# Patient Record
Sex: Female | Born: 1995 | Race: White | Hispanic: No | Marital: Married | Smoking: Never smoker
Health system: Southern US, Community
[De-identification: ages and names within clinical notes are randomized; demographics above are authoritative.]

## PROBLEM LIST (undated history)

## (undated) DIAGNOSIS — R945 Abnormal results of liver function studies: Secondary | ICD-10-CM

## (undated) DIAGNOSIS — R519 Headache, unspecified: Secondary | ICD-10-CM

## (undated) DIAGNOSIS — Z8489 Family history of other specified conditions: Secondary | ICD-10-CM

## (undated) DIAGNOSIS — R51 Headache: Secondary | ICD-10-CM

## (undated) DIAGNOSIS — K2970 Gastritis, unspecified, without bleeding: Secondary | ICD-10-CM

## (undated) DIAGNOSIS — R7989 Other specified abnormal findings of blood chemistry: Secondary | ICD-10-CM

## (undated) HISTORY — DX: Gastritis, unspecified, without bleeding: K29.70

---
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
## 2014-05-08 ENCOUNTER — Emergency Department: Payer: Self-pay | Admitting: Emergency Medicine
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
## 2016-12-17 NOTE — L&D Delivery Note (Signed)
Operative Delivery Note At  a  Viable  female was delivered via .VAVD   Presentation: vertex; Position: Right,, Occiput,, Anterior; Station: +5.  Verbal consent: obtained from patient.  Risks and benefits discussed in detail.  Risks include, but are not limited to the risks of anesthesia, bleeding, infection, damage to maternal tissues, fetal cephalhematoma.  There is also the risk of inability to effect vaginal delivery of the head, or shoulder dystocia that cannot be resolved by established maneuvers, leading to the need for emergency cesarean section.  APGAR:8/9  , ; weight  .   Placenta status: , . Intact   Cord:  with the following complications: .   Instruments:  Episiotomy:  MLE  Lacerations:  extension with partial third  Suture Repair: 2.0 3.0 vicryl Est. Blood Loss (mL):   SEE Dictated note  Mom to postpartum.  Baby to Couplet care / Skin to Skin.  Ihor Austinhomas J Macklen Wilhoite 08/01/2017, 3:39 AM

## 2017-01-09 ENCOUNTER — Emergency Department: Payer: 59

## 2017-01-09 ENCOUNTER — Observation Stay
Admission: EM | Admit: 2017-01-09 | Discharge: 2017-01-10 | Disposition: A | Discharge status: Home / Self Care | Payer: 59 | Attending: Internal Medicine | Admitting: Internal Medicine

## 2017-01-09 ENCOUNTER — Encounter: Payer: Self-pay | Admitting: *Deleted

## 2017-01-09 DIAGNOSIS — J111 Influenza due to unidentified influenza virus with other respiratory manifestations: Secondary | ICD-10-CM

## 2017-01-09 DIAGNOSIS — E860 Dehydration: Secondary | ICD-10-CM | POA: Diagnosis not present

## 2017-01-09 DIAGNOSIS — Z3A08 8 weeks gestation of pregnancy: Secondary | ICD-10-CM | POA: Diagnosis not present

## 2017-01-09 DIAGNOSIS — R0682 Tachypnea, not elsewhere classified: Secondary | ICD-10-CM

## 2017-01-09 DIAGNOSIS — J101 Influenza due to other identified influenza virus with other respiratory manifestations: Secondary | ICD-10-CM | POA: Diagnosis present

## 2017-01-09 DIAGNOSIS — O99281 Endocrine, nutritional and metabolic diseases complicating pregnancy, first trimester: Secondary | ICD-10-CM | POA: Diagnosis not present

## 2017-01-09 DIAGNOSIS — O99511 Diseases of the respiratory system complicating pregnancy, first trimester: Secondary | ICD-10-CM | POA: Diagnosis not present

## 2017-01-09 DIAGNOSIS — R509 Fever, unspecified: Secondary | ICD-10-CM | POA: Diagnosis present

## 2017-01-09 DIAGNOSIS — R000 Tachycardia, unspecified: Secondary | ICD-10-CM

## 2017-01-09 DIAGNOSIS — J9801 Acute bronchospasm: Secondary | ICD-10-CM

## 2017-01-09 DIAGNOSIS — E871 Hypo-osmolality and hyponatremia: Secondary | ICD-10-CM | POA: Insufficient documentation

## 2017-01-09 DIAGNOSIS — Z3687 Encounter for antenatal screening for uncertain dates: Secondary | ICD-10-CM

## 2017-01-09 LAB — CBC WITH DIFFERENTIAL/PLATELET
BASOS PCT: 0 %
Basophils Absolute: 0 10*3/uL (ref 0–0.1)
EOS ABS: 0 10*3/uL (ref 0–0.7)
EOS PCT: 0 %
HCT: 41 % (ref 35.0–47.0)
Hemoglobin: 14.1 g/dL (ref 12.0–16.0)
LYMPHS PCT: 10 %
Lymphs Abs: 0.8 10*3/uL — ABNORMAL LOW (ref 1.0–3.6)
MCH: 29.8 pg (ref 26.0–34.0)
MCHC: 34.4 g/dL (ref 32.0–36.0)
MCV: 86.6 fL (ref 80.0–100.0)
MONO ABS: 1 10*3/uL — AB (ref 0.2–0.9)
Monocytes Relative: 12 %
Neutro Abs: 6.4 10*3/uL (ref 1.4–6.5)
Neutrophils Relative %: 78 %
PLATELETS: 198 10*3/uL (ref 150–440)
RBC: 4.73 MIL/uL (ref 3.80–5.20)
RDW: 13.3 % (ref 11.5–14.5)
WBC: 8.1 10*3/uL (ref 3.6–11.0)

## 2017-01-09 LAB — URINALYSIS, COMPLETE (UACMP) WITH MICROSCOPIC
Bilirubin Urine: NEGATIVE
GLUCOSE, UA: NEGATIVE mg/dL
HGB URINE DIPSTICK: NEGATIVE
Ketones, ur: 80 mg/dL — AB
Leukocytes, UA: NEGATIVE
NITRITE: NEGATIVE
PH: 5 (ref 5.0–8.0)
PROTEIN: NEGATIVE mg/dL
RBC / HPF: NONE SEEN RBC/hpf (ref 0–5)
SPECIFIC GRAVITY, URINE: 1.019 (ref 1.005–1.030)

## 2017-01-09 LAB — COMPREHENSIVE METABOLIC PANEL
ALBUMIN: 4.6 g/dL (ref 3.5–5.0)
ALK PHOS: 69 U/L (ref 38–126)
ALT: 75 U/L — ABNORMAL HIGH (ref 14–54)
ANION GAP: 9 (ref 5–15)
AST: 41 U/L (ref 15–41)
BILIRUBIN TOTAL: 0.6 mg/dL (ref 0.3–1.2)
BUN: 6 mg/dL (ref 6–20)
CALCIUM: 9.1 mg/dL (ref 8.9–10.3)
CO2: 22 mmol/L (ref 22–32)
Chloride: 102 mmol/L (ref 101–111)
Creatinine, Ser: 0.63 mg/dL (ref 0.44–1.00)
GFR calc Af Amer: >60 mL/min (ref >60)
GFR calc non Af Amer: >60 mL/min (ref >60)
GLUCOSE: 93 mg/dL (ref 65–99)
POTASSIUM: 3.4 mmol/L — AB (ref 3.5–5.1)
SODIUM: 133 mmol/L — AB (ref 135–145)
Total Protein: 8.2 g/dL — ABNORMAL HIGH (ref 6.5–8.1)

## 2017-01-09 LAB — INFLUENZA PANEL BY PCR (TYPE A & B)
Influenza A By PCR: POSITIVE — AB
Influenza B By PCR: NEGATIVE

## 2017-01-09 LAB — BRAIN NATRIURETIC PEPTIDE: B NATRIURETIC PEPTIDE 5: 31 pg/mL (ref 0.0–100.0)

## 2017-01-09 LAB — TROPONIN I: Troponin I: <0.03 ng/mL (ref <0.03)

## 2017-01-09 MED ORDER — SODIUM CHLORIDE 0.9 % IV BOLUS (SEPSIS)
1000.0000 mL | Freq: Once | INTRAVENOUS | Status: AC
  Administered 2017-01-09: 1000 mL via INTRAVENOUS

## 2017-01-09 MED ORDER — PROMETHAZINE HCL 25 MG/ML IJ SOLN: 25.0000 mg | Freq: Four times a day (QID) | INTRAMUSCULAR | Status: DC | PRN

## 2017-01-09 MED ORDER — ACETAMINOPHEN 650 MG RE SUPP: 650.0000 mg | Freq: Four times a day (QID) | RECTAL | Status: DC | PRN

## 2017-01-09 MED ORDER — SODIUM CHLORIDE 0.9 % IV SOLN
INTRAVENOUS | Status: DC
  Administered 2017-01-09: 16:00:00 via INTRAVENOUS

## 2017-01-09 MED ORDER — SODIUM CHLORIDE 0.9% FLUSH: 3.0000 mL | Freq: Two times a day (BID) | INTRAVENOUS | Status: DC

## 2017-01-09 MED ORDER — ONDANSETRON HCL 4 MG/2ML IJ SOLN
2.0000 mg | Freq: Once | INTRAMUSCULAR | Status: AC
  Administered 2017-01-09: 2 mg via INTRAVENOUS

## 2017-01-09 MED ORDER — ONDANSETRON HCL 4 MG/2ML IJ SOLN
INTRAMUSCULAR | Status: AC
  Administered 2017-01-09: 2 mg via INTRAVENOUS
  Filled 2017-01-09: qty 2

## 2017-01-09 MED ORDER — OSELTAMIVIR PHOSPHATE 75 MG PO CAPS
75.0000 mg | ORAL_CAPSULE | Freq: Two times a day (BID) | ORAL | Status: DC
  Administered 2017-01-09 – 2017-01-10 (×2): 75 mg via ORAL
  Filled 2017-01-09 (×2): qty 1

## 2017-01-09 MED ORDER — ACETAMINOPHEN 325 MG PO TABS: 650.0000 mg | ORAL_TABLET | Freq: Four times a day (QID) | ORAL | Status: DC | PRN

## 2017-01-09 MED ORDER — OSELTAMIVIR PHOSPHATE 75 MG PO CAPS
75.0000 mg | ORAL_CAPSULE | Freq: Once | ORAL | Status: AC
  Administered 2017-01-09: 75 mg via ORAL
  Filled 2017-01-09: qty 1

## 2017-01-09 MED ORDER — ALBUTEROL SULFATE (2.5 MG/3ML) 0.083% IN NEBU
INHALATION_SOLUTION | RESPIRATORY_TRACT | Status: AC
  Administered 2017-01-09: 2.5 mg
  Filled 2017-01-09: qty 3

## 2017-01-09 MED ORDER — OSELTAMIVIR PHOSPHATE 75 MG PO CAPS: 75.0000 mg | ORAL_CAPSULE | Freq: Two times a day (BID) | ORAL | 0 refills | Status: DC

## 2017-01-09 MED ORDER — ALBUTEROL SULFATE HFA 108 (90 BASE) MCG/ACT IN AERS: 2.0000 | INHALATION_SPRAY | Freq: Four times a day (QID) | RESPIRATORY_TRACT | 0 refills | Status: DC | PRN

## 2017-01-09 MED ORDER — LOPERAMIDE HCL 2 MG PO CAPS
2.0000 mg | ORAL_CAPSULE | Freq: Three times a day (TID) | ORAL | Status: DC | PRN
Start: 2017-01-09 — End: 2017-01-10

## 2017-01-09 NOTE — Consult Note (Signed)
Consultation completed and dictated.  Cont tamiflu 75 mg 1-2x / d for upto 10 days Ob u/s in am  Add phenergan 25 mg q 6hr IV prn n/v Imodium 2 mg q 6hr prn diarrhea receheck elytes in am

## 2017-01-09 NOTE — ED Provider Notes (Signed)
[facility] Emergency Department Provider Note ____________________________________________   I have reviewed the triage vital signs and the triage nursing note.  HISTORY  Chief Complaint Nausea; Fever; and Cough   Historian Patient  HPI [name] [name] is a 21 y.o. female approx [redacted] weeks pregnant, several days of cough and congestion and body aches.  Fever at home to 101. Positive for shortness of breath and wheezing. No history of wheezing. No chest pain. States that she just sort of feels bad all over.  No abdominal pain. Positive for nausea without vomiting or diarrhea.  Symptoms are moderate.    History reviewed. No pertinent past medical history.  There are no active problems to display for this patient.   No past surgical history on file.  Prior to Admission medications   Medication Sig Start Date End Date Taking? Authorizing Provider  albuterol (PROVENTIL HFA;VENTOLIN HFA) 108 (90 Base) MCG/ACT inhaler Inhale 2 puffs into the lungs every 6 (six) hours as needed for wheezing or shortness of breath. 01/09/17   Governor Rooks, MD  oseltamivir (TAMIFLU) 75 MG capsule Take 1 capsule (75 mg total) by mouth 2 (two) times daily. 01/09/17 01/14/17  Governor Rooks, MD    Allergies  Allergen Reactions  . Sulfa Antibiotics     Childhood allergy, do not know    History reviewed. No pertinent family history.  Social History Social History  Substance Use Topics  . Smoking status: Not on file  . Smokeless tobacco: Not on file  . Alcohol use Not on file    Review of Systems  Constitutional: Positive for fever. Eyes: Negative for visual changes. ENT: Negative for sore throat. Cardiovascular: Negative for chest pain. Respiratory: Positive for shortness of breath. Gastrointestinal: Negative for abdominal pain. Genitourinary: Negative for dysuria. Musculoskeletal: Negative for back pain. Skin: Negative for rash. Neurological: Positive for mild global  headache. 10 point Review of Systems otherwise negative ____________________________________________   PHYSICAL EXAM:  VITAL SIGNS: ED Triage Vitals  Enc Vitals Group     BP 01/09/17 0945 (!) 116/51     Pulse Rate 01/09/17 0945 (!) 107     Resp 01/09/17 0945 18     Temp 01/09/17 0945 99.2 F (37.3 C)     Temp Source 01/09/17 0945 Oral     SpO2 01/09/17 0945 98 %     Weight 01/09/17 0944 157 lb (71.2 kg)     Height 01/09/17 0944 5\' 2"  (1.575 m)     Head Circumference --      Peak Flow --      Pain Score 01/09/17 0945 5     Pain Loc --      Pain Edu? --      Excl. in GC? --      Constitutional: Alert and oriented. Well appearing and in no distress. HEENT   Head: Normocephalic and atraumatic.      Eyes: Conjunctivae are normal. PERRL. Normal extraocular movements.      Ears:         Nose: No congestion/rhinnorhea.   Mouth/Throat: Mucous membranes are mildly dry.   Neck: No stridor. Cardiovascular/Chest: Tachycardic, regular rhythm.  No murmurs, rubs, or gallops. Respiratory: Normal respiratory effort without tachypnea nor retractions. Moderate rhonchi throughout all fields with mild to moderate wheezing in all fields. Gastrointestinal: Soft. No distention, no guarding, no rebound. Nontender.   Genitourinary/rectal:Deferred Musculoskeletal: Nontender with normal range of motion in all extremities. No joint effusions.  No lower extremity tenderness.  No  edema. Neurologic:  Normal speech and language. No gross or focal neurologic deficits are appreciated. Skin:  Skin is warm, dry and intact. No rash noted. Psychiatric: Mood and affect are normal. Speech and behavior are normal. Patient exhibits appropriate insight and judgment.   ____________________________________________  LABS (pertinent positives/negatives)  Labs Reviewed  URINALYSIS, COMPLETE (UACMP) WITH MICROSCOPIC - Abnormal; Notable for the following:       Result Value   Color, Urine YELLOW (*)     APPearance CLEAR (*)    Ketones, ur 80 (*)    Bacteria, UA RARE (*)    Squamous Epithelial / LPF 0-5 (*)    All other components within normal limits  INFLUENZA PANEL BY PCR (TYPE A & B) - Abnormal; Notable for the following:    Influenza A By PCR POSITIVE (*)    All other components within normal limits  COMPREHENSIVE METABOLIC PANEL - Abnormal; Notable for the following:    Sodium 133 (*)    Potassium 3.4 (*)    Total Protein 8.2 (*)    ALT 75 (*)    All other components within normal limits  CBC WITH DIFFERENTIAL/PLATELET - Abnormal; Notable for the following:    Lymphs Abs 0.8 (*)    Monocytes Absolute 1.0 (*)    All other components within normal limits  BRAIN NATRIURETIC PEPTIDE  TROPONIN I    ____________________________________________    EKG I, Governor Rooksebecca Cortland Crehan, MD, the attending physician have personally viewed and interpreted all ECGs.  109 bpm. Narrow distress. Normal axis. Sinus tachycardia. Normal ST and T-wave ____________________________________________  RADIOLOGY All Xrays were viewed by me. Imaging interpreted by Radiologist.  Chest x-ray two-view:  IMPRESSION: There is no evidence of pneumonia nor other acute cardiopulmonary abnormality. __________________________________________  PROCEDURES  Procedure(s) performed: None  Critical Care performed: None  ____________________________________________   ED COURSE / ASSESSMENT AND PLAN  Pertinent labs & imaging results that were available during my care of the patient were reviewed by me and considered in my medical decision making (see chart for details).   [name] [name] presents clinically with likely influenza, and upon testing is positive for influenza.  Slightly tachycardic.  Persistently tachycardic 107-117 after first liter fluid.  We discussed the risk and benefit of Tamiflu, including the risk and benefit of influenza while pregnant, and chose to treat with Tamiflu.  I'm going to give her  second liter of fluid to see if her tachycardia will resolve. She is not complaining specifically of chest pain, but I am going out on an EKG as well as troponin and BNP. Chest x-ray was without cardiopulmonary emergency findings.    Multiple repeat evaluations, patient still looks pale without energy. Heart rate continues to be between 105 and 120, while in the room is usually around 115 to 125. She is persistently tachycardic despite fluid rehydration and resolution of fever.  Persistent tachypnea 24-28. I'm concerned about the metabolic demands a persistent tachypnea and tachycardia despite resolution of fever and adequate fluid bolus resuscitation. Patient discussed with hospitalist for observation admission.    CONSULTATIONS:   Hospitalist for admission, Dr. Hilton SinclairWeiting.   Patient / Family / Caregiver informed of clinical course, medical decision-making process, and agree with plan.    ___________________________________________   FINAL CLINICAL IMPRESSION(S) / ED DIAGNOSES   Final diagnoses:  Influenza  Bronchospasm  Tachycardia  Tachypnea              Note: This dictation was prepared with Dragon dictation. Any transcriptional errors  that result from this process are unintentional    Governor Rooks, MD 01/09/17 1342

## 2017-01-09 NOTE — ED Notes (Signed)
Dr. Shaune PollackLord and this RN walked into room, pt HR 92, went up to 112. Pt alert and oriented, ambulatory to toilet when needed. 2 bags NS finished.

## 2017-01-09 NOTE — ED Notes (Signed)
Patient transported to X-ray 

## 2017-01-09 NOTE — ED Notes (Signed)
Dr. Chen at bedside.

## 2017-01-09 NOTE — ED Triage Notes (Signed)
States she is [redacted] weeks pregnant, states cough, fever, and vomiting for 2 days

## 2017-01-09 NOTE — ED Notes (Signed)
Pt ambulatory to toilet with no assistance.  Pt given ginger ale and saltines.

## 2017-01-09 NOTE — H&P (Addendum)
[employer] [facility] [place] at [facility]   PATIENT NAME: [name] [name]    MR#:  [id number]  DATE OF BIRTH:  07/01/1996  DATE OF ADMISSION:  01/09/2017  PRIMARY CARE PHYSICIAN: No PCP Per Patient   REQUESTING/REFERRING PHYSICIAN:  Governor Rooksebecca Lord, MD  CHIEF COMPLAINT:   Chief Complaint  Patient presents with  . Nausea  . Fever  . Cough   Fever, chills, cough, shortness of breath , nausea and vomiting several days HISTORY OF PRESENT ILLNESS:  Celia Alycia RossettiKoch  is a 21 y.o. female with No past medical history. The patient has approximately 8 weeks pregnancy. She has had fever 101, chills, cough and the shortness of breath for the past few days. She also complains of nausea and vomiting, has poor appetite. She was found tachycardia at 110-120 and tachypnea 25. She was given IV fluid 2 L without improvement. Influenza A is positive. She is given Tamiflu. Dr. Elpidio AnisLore requested observation.  PAST MEDICAL HISTORY:  History reviewed. No pertinent past medical history.  PAST SURGICAL HISTORY:  No past surgical history on file. No surgical history.  SOCIAL HISTORY:   Social History  Substance Use Topics  . Smoking status: Not on file  . Smokeless tobacco: Not on file  . Alcohol use Not on file  No smoking, alcohol drinking or illicit drugs.  FAMILY HISTORY:  History reviewed. No pertinent family history. No family history.  DRUG ALLERGIES:   Allergies  Allergen Reactions  . Sulfa Antibiotics     Childhood allergy, do not know    REVIEW OF SYSTEMS:   Review of Systems  Constitutional: Positive for chills, fever and malaise/fatigue.  HENT: Positive for sore throat. Negative for congestion.   Eyes: Negative for blurred vision.  Respiratory: Positive for cough and shortness of breath. Negative for hemoptysis, sputum production and wheezing.   Cardiovascular: Negative for chest pain, palpitations and leg swelling.  Gastrointestinal: Positive for nausea and vomiting.  Negative for abdominal pain, blood in stool, diarrhea and melena.  Genitourinary: Negative for dysuria and hematuria.  Musculoskeletal: Negative for joint pain.  Skin: Negative for itching and rash.  Neurological: Positive for weakness. Negative for dizziness, focal weakness and loss of consciousness.  Psychiatric/Behavioral: Negative for depression. The patient is not nervous/anxious.     MEDICATIONS AT HOME:   Prior to Admission medications   Medication Sig Start Date End Date Taking? Authorizing Provider  albuterol (PROVENTIL HFA;VENTOLIN HFA) 108 (90 Base) MCG/ACT inhaler Inhale 2 puffs into the lungs every 6 (six) hours as needed for wheezing or shortness of breath. 01/09/17   Governor Rooksebecca Lord, MD  oseltamivir (TAMIFLU) 75 MG capsule Take 1 capsule (75 mg total) by mouth 2 (two) times daily. 01/09/17 01/14/17  Governor Rooksebecca Lord, MD      VITAL SIGNS:  Blood pressure 128/76, pulse (!) 105, temperature 97.6 F (36.4 C), temperature source Oral, resp. rate 20, height 5\' 2"  (1.575 m), weight 157 lb (71.2 kg), SpO2 100 %.  PHYSICAL EXAMINATION:  Physical Exam  GENERAL:  21 y.o.-year-old patient lying in the bed with no acute distress.  EYES: Pupils equal, round, reactive to light and accommodation. No scleral icterus. Extraocular muscles intact.  HEENT: Head atraumatic, normocephalic. Oropharynx and nasopharynx clear.  NECK:  Supple, no jugular venous distention. No thyroid enlargement, no tenderness.  LUNGS: Normal breath sounds bilaterally, no wheezing, rales,rhonchi or crepitation. No use of accessory muscles of respiration.  CARDIOVASCULAR: S1, S2 Tachycardia. No murmurs, rubs, or gallops.  ABDOMEN: Soft, nontender,  nondistended. Bowel sounds present. No organomegaly or mass.  EXTREMITIES: No pedal edema, cyanosis, or clubbing.  NEUROLOGIC: Cranial nerves II through XII are intact. Muscle strength 5/5 in all extremities. Sensation intact. Gait not checked.  PSYCHIATRIC: The patient is alert  and oriented x 3.  SKIN: No obvious rash, lesion, or ulcer.   LABORATORY PANEL:   CBC  Recent Labs Lab 01/09/17 0959  WBC 8.1  HGB 14.1  HCT 41.0  PLT 198   ------------------------------------------------------------------------------------------------------------------  Chemistries   Recent Labs Lab 01/09/17 0959  NA 133*  K 3.4*  CL 102  CO2 22  GLUCOSE 93  BUN 6  CREATININE 0.63  CALCIUM 9.1  AST 41  ALT 75*  ALKPHOS 69  BILITOT 0.6   ------------------------------------------------------------------------------------------------------------------  Cardiac Enzymes  Recent Labs Lab 01/09/17 1205  TROPONINI <0.03   ------------------------------------------------------------------------------------------------------------------  RADIOLOGY:  Dg Chest 2 View  Result Date: 01/09/2017 CLINICAL DATA:  Fever and cough and vomiting since yesterday also midchest pain. The patient is currently pregnant. EXAM: CHEST  2 VIEW COMPARISON:  Report of a chest x-ray dated December 16, 1995 FINDINGS: The lungs are adequately inflated. There is no focal infiltrate. There is no pleural effusion. The heart and pulmonary vascularity are normal. The mediastinum is normal in width. The bony thorax exhibits no acute abnormality. IMPRESSION: There is no evidence of pneumonia nor other acute cardiopulmonary abnormality. Electronically Signed   By: David  [place] M.D.   On: 01/09/2017 10:57      IMPRESSION AND PLAN:   Influenza A with tachycardia and tachypnea The patient will be placed for observation. Continue Tamiflu and supportive care.  Hyponatremia and dehydration. Give normal saline IV.  Hypokalemia. Encouraged oral intake Pregnancy 8 weeks.  All the records are reviewed and case discussed with ED provider. Management plans discussed with the patient, family and they are in agreement.  CODE STATUS: full code TOTAL TIME TAKING CARE OF THIS PATIENT: 46 minutes.     Shaune Pollack M.D on 01/09/2017 at 2:18 PM  Between 7am to 6pm - Pager - [phone]  After 6pm go to [email or web address] - Scientist, research (life sciences) [facility]  Office  [phone]  CC: Primary care physician; No PCP Per Patient   Note: This dictation was prepared with Dragon dictation along with smaller phrase technology. Any transcriptional errors that result from this process are unintentional.

## 2017-01-10 ENCOUNTER — Observation Stay: Payer: 59

## 2017-01-10 LAB — COMPREHENSIVE METABOLIC PANEL WITH GFR
ALT: 50 U/L (ref 14–54)
AST: 28 U/L (ref 15–41)
Albumin: 3.2 g/dL — ABNORMAL LOW (ref 3.5–5.0)
Alkaline Phosphatase: 60 U/L (ref 38–126)
Anion gap: 4 — ABNORMAL LOW (ref 5–15)
BUN: <5 mg/dL — ABNORMAL LOW (ref 6–20)
CO2: 23 mmol/L (ref 22–32)
Calcium: 7.8 mg/dL — ABNORMAL LOW (ref 8.9–10.3)
Chloride: 110 mmol/L (ref 101–111)
Creatinine, Ser: 0.5 mg/dL (ref 0.44–1.00)
GFR calc Af Amer: >60 mL/min
GFR calc non Af Amer: >60 mL/min
Glucose, Bld: 81 mg/dL (ref 65–99)
Potassium: 3.3 mmol/L — ABNORMAL LOW (ref 3.5–5.1)
Sodium: 137 mmol/L (ref 135–145)
Total Bilirubin: 0.7 mg/dL (ref 0.3–1.2)
Total Protein: 6 g/dL — ABNORMAL LOW (ref 6.5–8.1)

## 2017-01-10 LAB — HCG, QUANTITATIVE, PREGNANCY: HCG, BETA CHAIN, QUANT, S: 116937 m[IU]/mL — AB (ref <5)

## 2017-01-10 MED ORDER — POTASSIUM CHLORIDE CRYS ER 20 MEQ PO TBCR
40.0000 meq | EXTENDED_RELEASE_TABLET | Freq: Once | ORAL | Status: AC
  Administered 2017-01-10: 40 meq via ORAL
  Filled 2017-01-10: qty 2

## 2017-01-10 MED ORDER — OSELTAMIVIR PHOSPHATE 75 MG PO CAPS: 75.0000 mg | ORAL_CAPSULE | Freq: Two times a day (BID) | ORAL | 0 refills | Status: DC

## 2017-01-10 NOTE — Progress Notes (Addendum)
Pt A and O x 4. VSS. Pt tolerating diet well. No complaints of pain or nausea. IV removed intact, prescriptions given. Spoke with Dr. Allena KatzPatel and pt does not need a prescription for inhaler if she has not been taking it at home. Pt voiced understanding of discharge instructions with no further questions. Pt refused wheelchair and ambulated down independently.

## 2017-01-10 NOTE — Progress Notes (Signed)
[employer] - [place] at [facility]        Ewelina Alycia RossettiKoch was admitted to the Hospital on 01/09/2017 and Discharged  01/10/2017 and should be excused from work for 5 days starting 01/09/2017 , may return to work without any restrictions on 01/14/2017  Delfino LovettVipul Davisha Linthicum M.D on 01/10/2017,at 11:35 AM  [employer] - [place] at [facility]    Office  [phone]

## 2017-01-10 NOTE — Progress Notes (Signed)
Per Dr. Sherryll BurgerShah okay to discharge patient following ultrasound

## 2017-01-10 NOTE — Progress Notes (Signed)
Notified Dr. Sherryll BurgerShah that in order for ultrasound to be performed blood needs to be drawn for Beta HCG. Will place order per MD.

## 2017-01-10 NOTE — Discharge Instructions (Signed)
Return to the emergency department for any worsening trouble breathing, shortness breath, palpitations, chest pain, dizziness or passing out, confusion altered mental status, or any other symptoms concerning to you.   Influenza, Adult Influenza, more commonly known as the flu, is a viral infection that primarily affects the respiratory tract. The respiratory tract includes organs that help you breathe, such as the lungs, nose, and throat. The flu causes many common cold symptoms, as well as a high fever and body aches. The flu spreads easily from person to person (is contagious). Getting a flu shot (influenza vaccination) every year is the best way to prevent influenza. What are the causes? Influenza is caused by a virus. You can catch the virus by:  Breathing in droplets from an infected person's cough or sneeze.  Touching something that was recently contaminated with the virus and then touching your mouth, nose, or eyes. What increases the risk? The following factors may make you more likely to get the flu:  Not cleaning your hands frequently with soap and water or alcohol-based hand sanitizer.  Having close contact with many people during cold and flu season.  Touching your mouth, eyes, or nose without washing or sanitizing your hands first.  Not drinking enough fluids or not eating a healthy diet.  Not getting enough sleep or exercise.  Being under a high amount of stress.  Not getting a yearly (annual) flu shot. You may be at a higher risk of complications from the flu, such as a severe lung infection (pneumonia), if you:  Are over the age of 25.  Are pregnant.  Have a weakened disease-fighting system (immune system). You may have a weakened immune system if you:  Have HIV or AIDS.  Are undergoing chemotherapy.  Aretaking medicines that reduce the activity of (suppress) the immune system.  Have a long-term (chronic) illness, such as heart disease, kidney disease,  diabetes, or lung disease.  Have a liver disorder.  Are obese.  Have anemia. What are the signs or symptoms? Symptoms of this condition typically last 4-10 days and may include:  Fever.  Chills.  Headache, body aches, or muscle aches.  Sore throat.  Cough.  Runny or congested nose.  Chest discomfort and cough.  Poor appetite.  Weakness or tiredness (fatigue).  Dizziness.  Nausea or vomiting. How is this diagnosed? This condition may be diagnosed based on your medical history and a physical exam. Your health care provider may do a nose or throat swab test to confirm the diagnosis. How is this treated? If influenza is detected early, you can be treated with antiviral medicine that can reduce the length of your illness and the severity of your symptoms. This medicine may be given by mouth (orally) or through an IV tube that is inserted in one of your veins. The goal of treatment is to relieve symptoms by taking care of yourself at home. This may include taking over-the-counter medicines, drinking plenty of fluids, and adding humidity to the air in your home. In some cases, influenza goes away on its own. Severe influenza or complications from influenza may be treated in a hospital. Follow these instructions at home:  Take over-the-counter and prescription medicines only as told by your health care provider.  Use a cool mist humidifier to add humidity to the air in your home. This can make breathing easier.  Rest as needed.  Drink enough fluid to keep your urine clear or pale yellow.  Cover your mouth and nose when you  cough or sneeze.  Wash your hands with soap and water often, especially after you cough or sneeze. If soap and water are not available, use hand sanitizer.  Stay home from work or school as told by your health care provider. Unless you are visiting your health care provider, try to avoid leaving home until your fever has been gone for 24 hours without the  use of medicine.  Keep all follow-up visits as told by your health care provider. This is important. How is this prevented?  Getting an annual flu shot is the best way to avoid getting the flu. You may get the flu shot in late summer, fall, or winter. Ask your health care provider when you should get your flu shot.  Wash your hands often or use hand sanitizer often.  Avoid contact with people who are sick during cold and flu season.  Eat a healthy diet, drink plenty of fluids, get enough sleep, and exercise regularly. Contact a health care provider if:  You develop new symptoms.  You have:  Chest pain.  Diarrhea.  A fever.  Your cough gets worse.  You produce more mucus.  You feel nauseous or you vomit. Get help right away if:  You develop shortness of breath or difficulty breathing.  Your skin or nails turn a bluish color.  You have severe pain or stiffness in your neck.  You develop a sudden headache or sudden pain in your face or ear.  You cannot stop vomiting. This information is not intended to replace advice given to you by your health care provider. Make sure you discuss any questions you have with your health care provider. Document Released: 11/30/2000 Document Revised: 05/10/2016 Document Reviewed: 09/27/2015 Elsevier Interactive Patient Education  2017 [employer]

## 2017-01-11 NOTE — Consult Note (Signed)
NAMHart [name]:  [name], [name]                 ACCOUNT NO.:  [id number]  MEDICAL RECORD NO.:  [id number]  LOCATION:                                 FACILITY:  PHYSICIAN:  Jennell Cornerhomas Selig Wampole, MDDATE OF BIRTH:  Apr 27, 1996  DATE OF CONSULTATION: DATE OF DISCHARGE:                                CONSULTATION   REQUESTING PHYSICIAN:  Dr. Imogene Burnhen.  CONSULTANT:  Jennell Cornerhomas Emmanual Gauthreaux, MD.  HISTORY OF PRESENT ILLNESS:  This is a 21 year old, gravida 1, para 0 with a last menstrual date of November 09, 2016, equivalent to 8 weeks 5 days estimated gestational age, who presented [facility] with a 1-day history of nausea, vomiting, fever, and shortness of breath.  The patient was diagnosed with influenza A in the emergency department and was admitted for observation.  The patient has been started on Tamiflu 75 mg twice a day.  PAST MEDICAL HISTORY:  None.  PAST SURGICAL HISTORY:  None.  REVIEW OF SYSTEMS:  Unremarkable except for PULMONARY:  Shortness of breath.  GENERAL:  Fever.  FAMILY HISTORY:  Noncontributory.  ALLERGIES:  SULFA.  SOCIAL HISTORY:  Does not drink.  Does not drink.  PHYSICAL EXAMINATION:  GENERAL:  Well-developed, well-nourished white female, slightly ill-appearing. VITAL SIGNS:  Temperature 99.7, blood pressure 111/55, respiratory rate 18, pulse 95, pulse ox 99. LUNGS:  Inspiratory wheezing bilaterally.  Decreased breath sounds, right middle lobe. CARDIOVASCULAR:  Regular rate and rhythm without murmur. ABDOMEN:  Soft, nontender. PELVIC:  Deferred.  ASSESSMENT:  Influenza A infection and early gestation.  The patient was not previously vaccinated against influenza.  Given the patient's pregnant status, influenza infections may be more complicated and the patient has 5 to 6 times greater chance of maternal death versus a nonpregnant status.  PLAN: 1. Supportive care.  IV medication as previously started. 2. Continue Tamiflu 75 mg 1 to 2 times a  day up to 10 days based on     clinical course. 3. We will schedule a pelvic ultrasound to verify and confirm     gestational age. 4. Add Phenergan 25 mg intravenous every 6 hours for nausea and     vomiting. 5. I did speak to the patient about developing congenital anomalies     found in the patients that have influenza,     specifically cleft palate 22creased, neural tube defects increased,     22ydrocephalus increased, and heart defects 22increased.  This will be     followed up with fetal evaluation later in pregnancy.  We will be     available for continued consultation if clinical course does not     improve in the next 24 to 48 hours.    ______________________________ Jennell Cornerhomas Tiffane Sheldon, MD   ______________________________ Jennell Cornerhomas Tiera Mensinger, MD    TS/MEDQ  D:  01/09/2017  T:  01/10/2017  Job:  [id number]

## 2017-01-13 NOTE — Discharge Summary (Signed)
[facility] -  at [facility]   PATIENT NAME: [name] [name]    MR#:  [id number]  DATE OF BIRTH:  02/17/1996  DATE OF ADMISSION:  01/09/2017   ADMITTING PHYSICIAN: Shaune PollackQing Chen, MD  DATE OF DISCHARGE: 01/10/2017  2:34 PM  PRIMARY CARE PHYSICIAN: Christeen DouglasBEASLEY, BETHANY, MD   ADMISSION DIAGNOSIS:  Tachypnea [R06.82] Bronchospasm [J98.01] Tachycardia [R00.0] Influenza [J11.1] DISCHARGE DIAGNOSIS:  Active Problems:   Influenza A  SECONDARY DIAGNOSIS:  History reviewed. No pertinent past medical history. HOSPITAL COURSE:  21 y.o. female with No past medical history. The patient has approximately 8 weeks pregnancy. She has had fever 101, chills, cough and the shortness of breath for the past few days. She also complained of nausea and vomiting, has poor appetite. She was found tachycardia at 110-120 and tachypnea 25 in ED. Was given 2-3 liters of IVFs and improved.  * Influenza A with tachycardia and tachypnea - improved with Tamiflu and supportive care.  * Hyponatremia and dehydration. - improved with ivfs  * Hypokalemia: replteted  * Pregnancy 8 weeks. OB US seem appropriate. OB recommended outpt f/up DISCHARGE CONDITIONS:  stable CONSULTS OBTAINED:  Treatment Team:  Suzy Bouchardhomas J Schermerhorn, MD DRUG ALLERGIES:   Allergies  Allergen Reactions  . Sulfa Antibiotics     Childhood allergy, do not know   DISCHARGE MEDICATIONS:   Allergies as of 01/10/2017      Reactions   Sulfa Antibiotics    Childhood allergy, do not know      Medication List    TAKE these medications   albuterol 108 (90 Base) MCG/ACT inhaler Commonly known as:  PROVENTIL HFA;VENTOLIN HFA Inhale 2 puffs into the lungs every 6 (six) hours as needed for wheezing or shortness of breath.   oseltamivir 75 MG capsule Commonly known as:  TAMIFLU Take 1 capsule (75 mg total) by mouth 2 (two) times daily.        DISCHARGE INSTRUCTIONS:   DIET:  Regular diet DISCHARGE CONDITION:    Good ACTIVITY:  Activity as tolerated OXYGEN:  Home Oxygen: No.  Oxygen Delivery: room air DISCHARGE LOCATION:  home   If you experience worsening of your admission symptoms, develop shortness of breath, life threatening emergency, suicidal or homicidal thoughts you must seek medical attention immediately by calling 911 or calling your MD immediately  if symptoms less severe.  You Must read complete instructions/literature along with all the possible adverse reactions/side effects for all the Medicines you take and that have been prescribed to you. Take any new Medicines after you have completely understood and accpet all the possible adverse reactions/side effects.   Please note  You were cared for by a hospitalist during your hospital stay. If you have any questions about your discharge medications or the care you received while you were in the hospital after you are discharged, you can call the unit and asked to speak with the hospitalist on call if the hospitalist that took care of you is not available. Once you are discharged, your primary care physician will handle any further medical issues. Please note that NO REFILLS for any discharge medications will be authorized once you are discharged, as it is imperative that you return to your primary care physician (or establish a relationship with a primary care physician if you do not have one) for your aftercare needs so that they can reassess your need for medications and monitor your lab values.    On the day of Discharge:  VITAL SIGNS:  Blood pressure (!) 112/58, pulse (!) 55, temperature 98 F (36.7 C), temperature source Oral, resp. rate 18, height 5\' 2"  (1.575 m), weight 72.7 kg (160 lb 3.2 oz), SpO2 100 %. PHYSICAL EXAMINATION:  GENERAL:  21 y.o.-year-old patient lying in the bed with no acute distress.  EYES: Pupils equal, round, reactive to light and accommodation. No scleral icterus. Extraocular muscles intact.  HEENT: Head  atraumatic, normocephalic. Oropharynx and nasopharynx clear.  NECK:  Supple, no jugular venous distention. No thyroid enlargement, no tenderness.  LUNGS: Normal breath sounds bilaterally, no wheezing, rales,rhonchi or crepitation. No use of accessory muscles of respiration.  CARDIOVASCULAR: S1, S2 normal. No murmurs, rubs, or gallops.  ABDOMEN: Soft, non-tender, non-distended. Bowel sounds present. No organomegaly or mass.  EXTREMITIES: No pedal edema, cyanosis, or clubbing.  NEUROLOGIC: Cranial nerves II through XII are intact. Muscle strength 5/5 in all extremities. Sensation intact. Gait not checked.  PSYCHIATRIC: The patient is alert and oriented x 3.  SKIN: No obvious rash, lesion, or ulcer.  DATA REVIEW:   CBC  Recent Labs Lab 01/09/17 0959  WBC 8.1  HGB 14.1  HCT 41.0  PLT 198    Chemistries   Recent Labs Lab 01/10/17 0550  NA 137  K 3.3*  CL 110  CO2 23  GLUCOSE 81  BUN <5*  CREATININE 0.50  CALCIUM 7.8*  AST 28  ALT 50  ALKPHOS 60  BILITOT 0.7    Follow-up Information    Christeen Douglas, MD. Schedule an appointment as soon as possible for a visit in 1 week(s).   Specialty:  Obstetrics and Gynecology Why:  Dr. Francena Hanly office will call you with an appointment.  Please call if you don't hear from them by tomorrow.  [phone] Contact information: [address] [facility] [address] [phone]           Management plans discussed with the patient, family and they are in agreement.  CODE STATUS:  Code Status History    Date Active Date Inactive Code Status Order ID Comments User Context   01/09/2017  3:46 PM 01/10/2017  5:39 PM Full Code [id number]  Shaune Pollack, MD Inpatient      TOTAL TIME TAKING CARE OF THIS PATIENT: 45 minutes.    Delfino Lovett M.D on 01/13/2017 at 2:45 PM  Between 7am to 6pm - Pager - [phone]  After 6pm go to [email or web address] - Social research officer, government  [facility]  Office   [phone]  CC: Primary care physician; Christeen Douglas, MD   Note: This dictation was prepared with Dragon dictation along with smaller phrase technology. Any transcriptional errors that result from this process are unintentional.

## 2017-02-13 LAB — OB RESULTS CONSOLE ABO/RH: RH Type: POSITIVE

## 2017-02-13 LAB — OB RESULTS CONSOLE VARICELLA ZOSTER ANTIBODY, IGG: Varicella: IMMUNE

## 2017-02-13 LAB — OB RESULTS CONSOLE RPR: RPR: NONREACTIVE

## 2017-02-13 LAB — OB RESULTS CONSOLE HEPATITIS B SURFACE ANTIGEN: HEP B S AG: NEGATIVE

## 2017-02-13 LAB — OB RESULTS CONSOLE RUBELLA ANTIBODY, IGM: RUBELLA: IMMUNE

## 2017-02-13 LAB — OB RESULTS CONSOLE HIV ANTIBODY (ROUTINE TESTING): HIV: NONREACTIVE

## 2017-02-13 LAB — OB RESULTS CONSOLE ANTIBODY SCREEN: Antibody Screen: NEGATIVE

## 2017-02-15 IMAGING — US US OB COMP LESS 14 WK
1 series · 14 of 28 positions shown · non-contrast
Comparison: None.

CLINICAL DATA: LMP 11/09/2016. EDC by LMP is 08/16/2017. Estimated
gestational age is 8 weeks 6 days. Uncertain dating. Positive for
flu. Dehydration. Nausea and vomiting since [REDACTED].

EXAM:
OBSTETRIC <14 WK US AND TRANSVAGINAL OB US
TECHNIQUE: Both transabdominal and transvaginal ultrasound examinations were
performed for complete evaluation of the gestation as well as the
maternal uterus, adnexal regions, and pelvic cul-de-sac.
Transvaginal technique was performed to assess early pregnancy.

[Series 1: us ob comp less 14 wk · 14 of 79 slices shown]
[im 3/79]
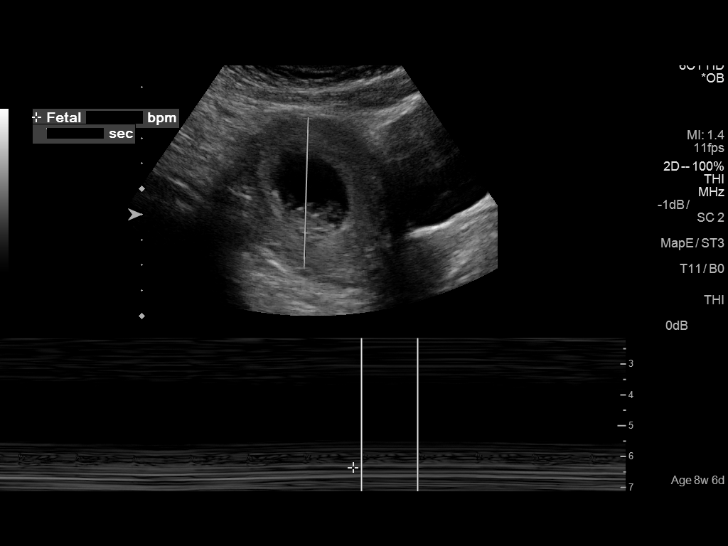
[im 9/79]
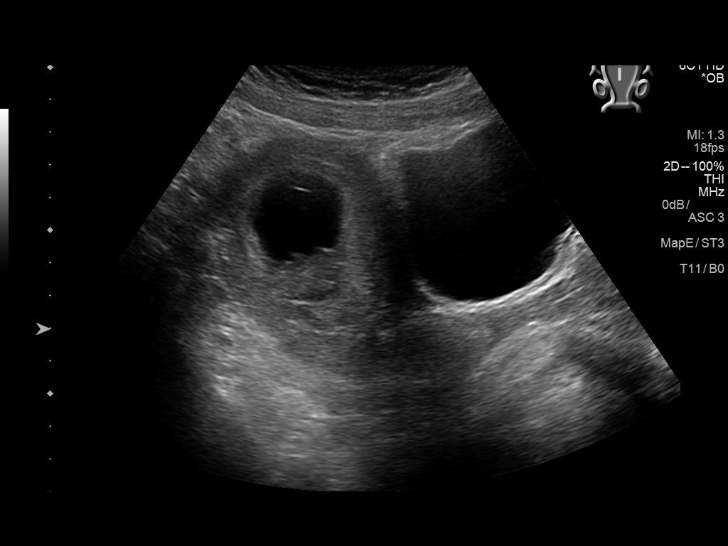
[im 15/79]
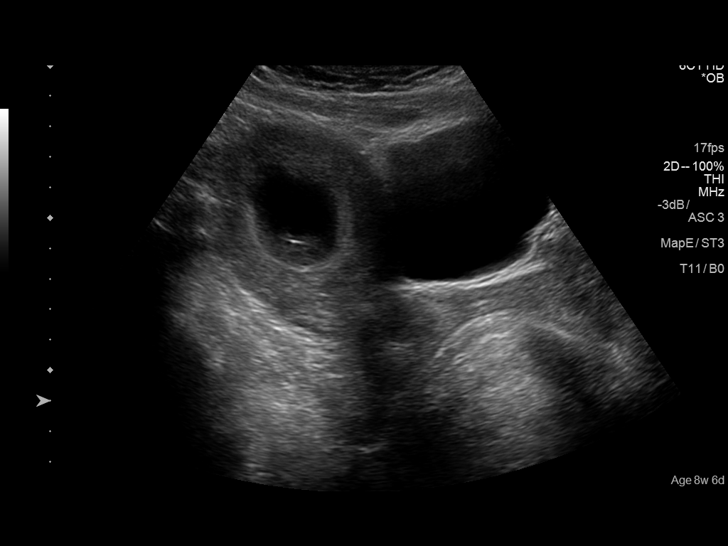
[im 21/79]
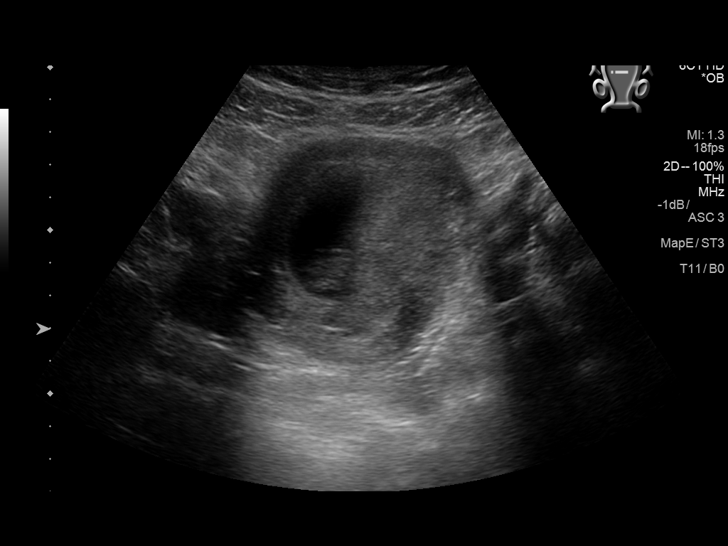
[im 27/79]
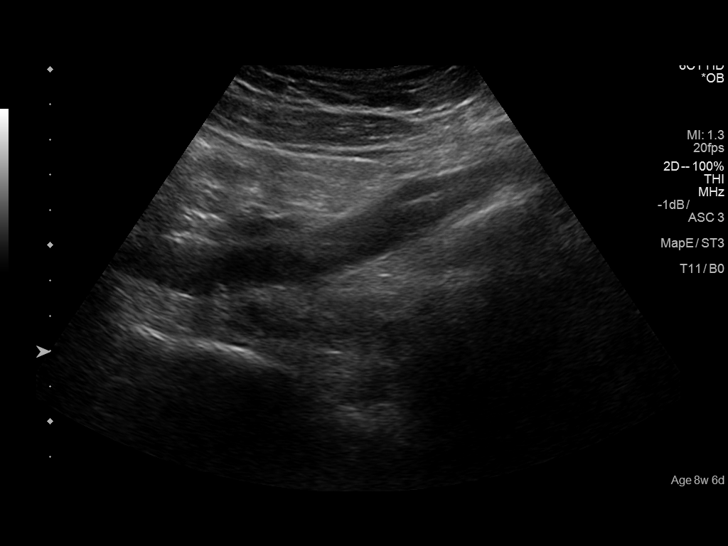
[im 32/79]
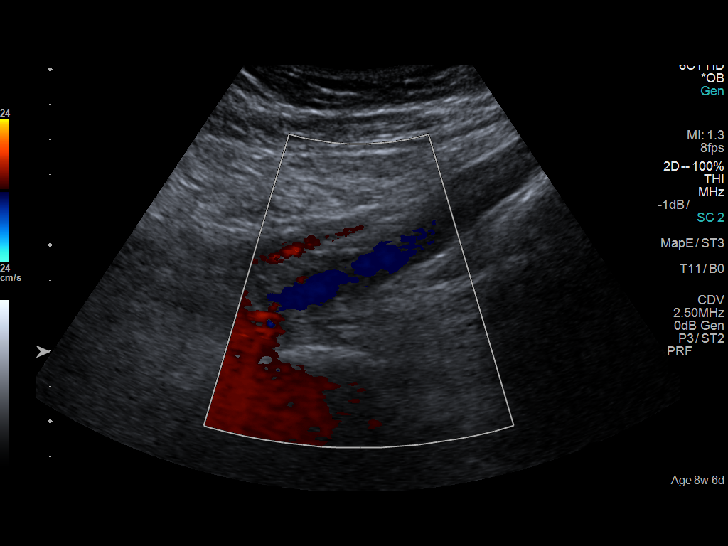
[im 38/79]
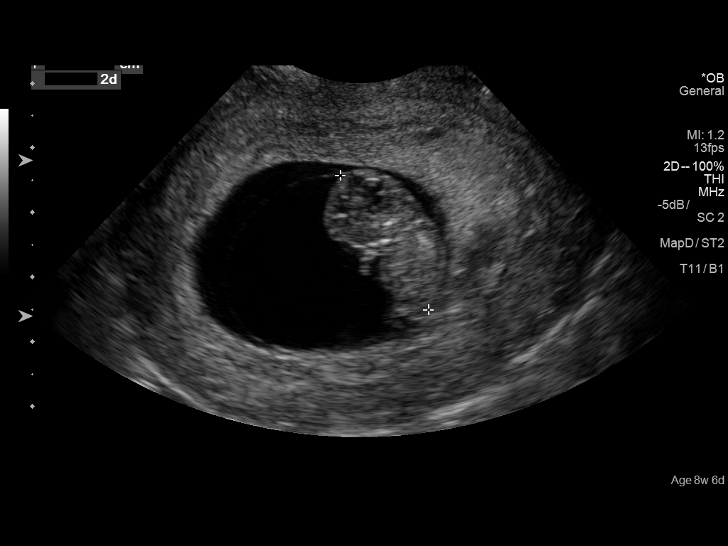
[im 44/79]
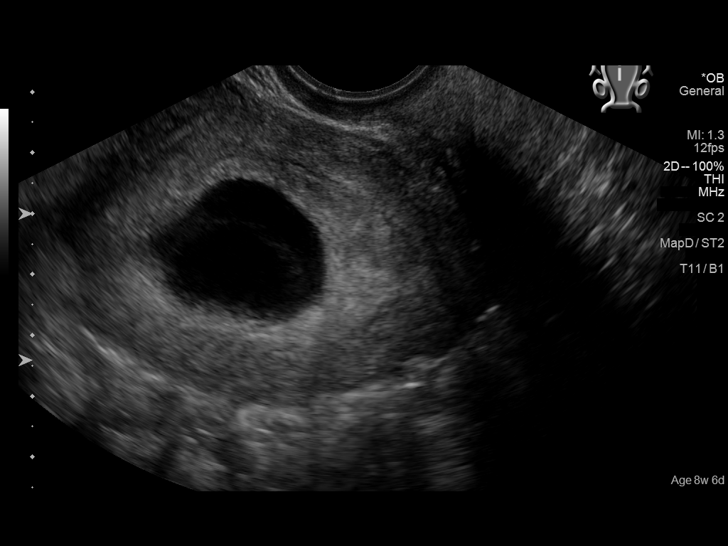
[im 50/79]
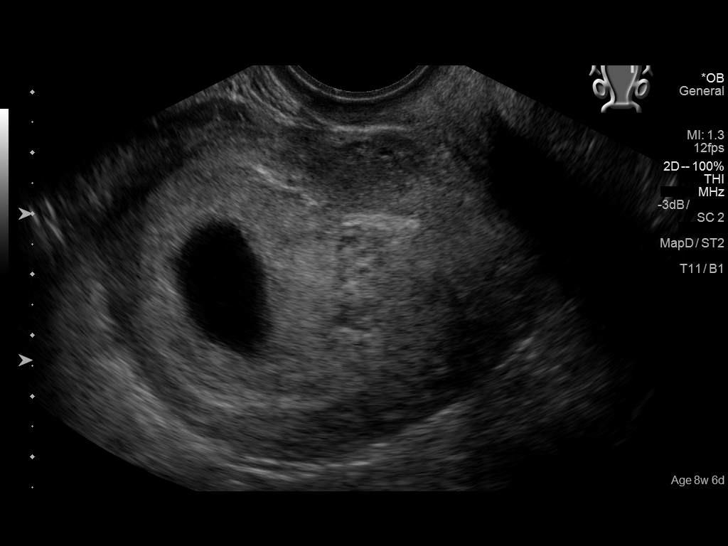
[im 55/79]
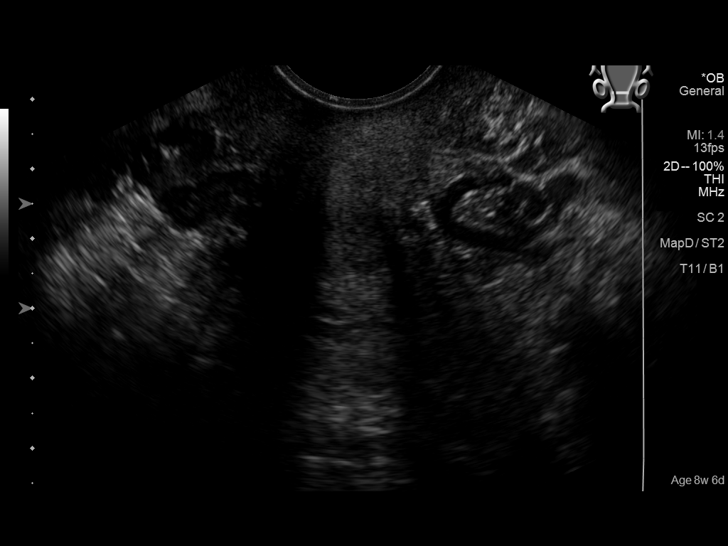
[im 61/79]
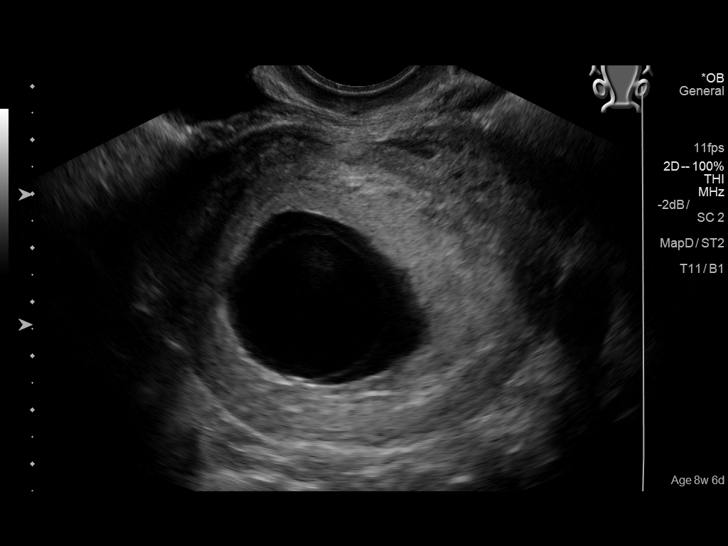
[im 67/79]
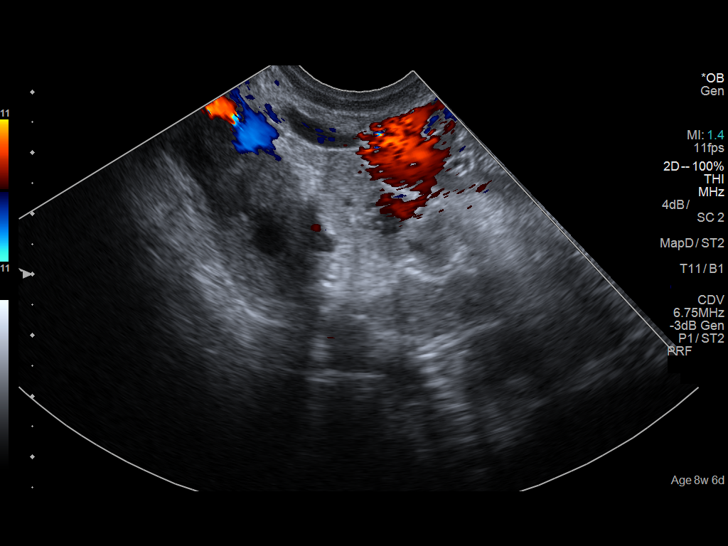
[im 73/79]
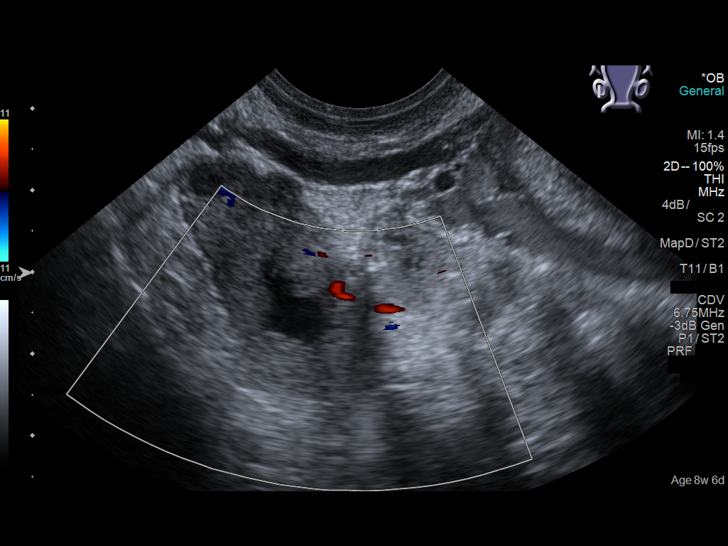
[im 79/79]
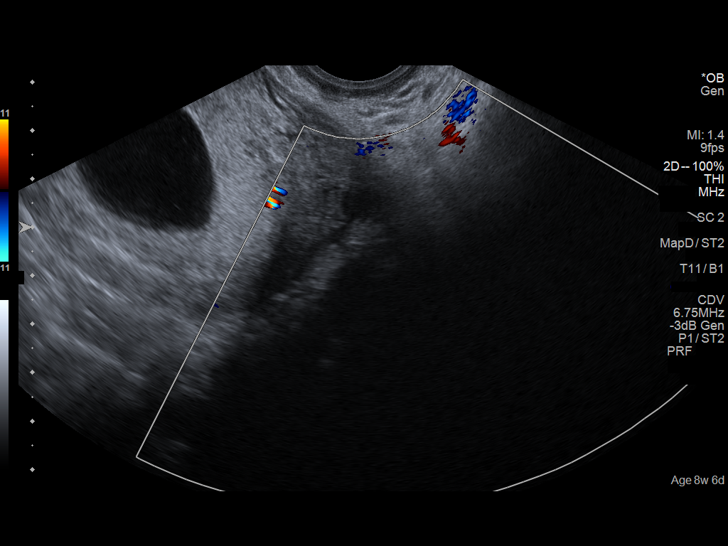

[14 of 28 positions shown; findings below may reference images not displayed]

FINDINGS: Intrauterine gestational sac: Present

Yolk sac:  Present

Embryo:  Present

Cardiac Activity: Present

Heart Rate: 171  bpm

CRL:  24.2  mm   9 w   1 d                  US EDC: 08/14/2017

Subchorionic hemorrhage:  None

Maternal uterus/adnexae: Right corpus luteum cyst is 1.9 cm. Left
ovary is not seen secondary to overlying bowel gas.
IMPRESSION: 1. Single living intrauterine embryo.
2. Size and dates correlate well. Today's exam confirms clinical EDC
of 08/16/2017.

## 2017-07-24 LAB — OB RESULTS CONSOLE GC/CHLAMYDIA
Chlamydia: NEGATIVE
Gonorrhea: NEGATIVE

## 2017-07-24 LAB — OB RESULTS CONSOLE GBS: GBS: NEGATIVE

## 2017-07-24 LAB — OB RESULTS CONSOLE RPR: RPR: NONREACTIVE

## 2017-07-30 ENCOUNTER — Inpatient Hospital Stay
Admission: EM | Admit: 2017-07-30 | Discharge: 2017-08-03 | DRG: 775 | Disposition: A | Discharge status: Home / Self Care | Payer: Medicaid Other | Attending: Obstetrics and Gynecology | Admitting: Obstetrics and Gynecology

## 2017-07-30 DIAGNOSIS — O368190 Decreased fetal movements, unspecified trimester, not applicable or unspecified: Secondary | ICD-10-CM | POA: Diagnosis present

## 2017-07-30 DIAGNOSIS — O4292 Full-term premature rupture of membranes, unspecified as to length of time between rupture and onset of labor: Principal | ICD-10-CM | POA: Diagnosis present

## 2017-07-30 DIAGNOSIS — O368130 Decreased fetal movements, third trimester, not applicable or unspecified: Secondary | ICD-10-CM | POA: Diagnosis present

## 2017-07-30 DIAGNOSIS — O4290 Premature rupture of membranes, unspecified as to length of time between rupture and onset of labor, unspecified weeks of gestation: Secondary | ICD-10-CM | POA: Diagnosis present

## 2017-07-30 DIAGNOSIS — Z3A37 37 weeks gestation of pregnancy: Secondary | ICD-10-CM | POA: Diagnosis not present

## 2017-07-30 LAB — CHLAMYDIA/NGC RT PCR (ARMC ONLY)
Chlamydia Tr: NOT DETECTED
N GONORRHOEAE: NOT DETECTED

## 2017-07-30 LAB — RUPTURE OF MEMBRANE (ROM)PLUS: Rom Plus: POSITIVE

## 2017-07-30 LAB — TYPE AND SCREEN
ABO/RH(D): O POS
Antibody Screen: NEGATIVE

## 2017-07-30 LAB — CBC
HCT: 34.5 % — ABNORMAL LOW (ref 35.0–47.0)
Hemoglobin: 11.7 g/dL — ABNORMAL LOW (ref 12.0–16.0)
MCH: 29.3 pg (ref 26.0–34.0)
MCHC: 34 g/dL (ref 32.0–36.0)
MCV: 86.4 fL (ref 80.0–100.0)
PLATELETS: 267 10*3/uL (ref 150–440)
RBC: 4 MIL/uL (ref 3.80–5.20)
RDW: 14 % (ref 11.5–14.5)
WBC: 10.4 10*3/uL (ref 3.6–11.0)

## 2017-07-30 MED ORDER — SOD CITRATE-CITRIC ACID 500-334 MG/5ML PO SOLN: 30.0000 mL | ORAL | Status: DC | PRN

## 2017-07-30 MED ORDER — OXYTOCIN 40 UNITS IN LACTATED RINGERS INFUSION - SIMPLE MED
2.5000 [IU]/h | INTRAVENOUS | Status: DC
  Filled 2017-07-30: qty 1000

## 2017-07-30 MED ORDER — AMMONIA AROMATIC IN INHA
RESPIRATORY_TRACT | Status: AC
  Filled 2017-07-30: qty 10

## 2017-07-30 MED ORDER — OXYTOCIN 40 UNITS IN LACTATED RINGERS INFUSION - SIMPLE MED
1.0000 m[IU]/min | INTRAVENOUS | Status: DC
  Administered 2017-07-30: 1 m[IU]/min via INTRAVENOUS
  Filled 2017-07-30: qty 1000

## 2017-07-30 MED ORDER — TERBUTALINE SULFATE 1 MG/ML IJ SOLN: 0.2500 mg | Freq: Once | INTRAMUSCULAR | Status: DC | PRN

## 2017-07-30 MED ORDER — OXYTOCIN BOLUS FROM INFUSION
500.0000 mL | Freq: Once | INTRAVENOUS | Status: AC
  Administered 2017-08-01: 500 mL via INTRAVENOUS

## 2017-07-30 MED ORDER — OXYCODONE-ACETAMINOPHEN 5-325 MG PO TABS: 1.0000 | ORAL_TABLET | ORAL | Status: DC | PRN

## 2017-07-30 MED ORDER — ACETAMINOPHEN 325 MG PO TABS
650.0000 mg | ORAL_TABLET | ORAL | Status: DC | PRN
  Administered 2017-08-01: 650 mg via ORAL
  Filled 2017-07-30: qty 2

## 2017-07-30 MED ORDER — LIDOCAINE HCL (PF) 1 % IJ SOLN
30.0000 mL | INTRAMUSCULAR | Status: DC | PRN
  Administered 2017-08-01: 30 mL via SUBCUTANEOUS

## 2017-07-30 MED ORDER — OXYCODONE-ACETAMINOPHEN 5-325 MG PO TABS: 2.0000 | ORAL_TABLET | ORAL | Status: DC | PRN

## 2017-07-30 MED ORDER — LACTATED RINGERS IV SOLN
INTRAVENOUS | Status: DC
Start: 2017-07-30 — End: 2017-08-01
  Administered 2017-07-30: 125 mL/h via INTRAVENOUS
  Administered 2017-07-31: 18:00:00 via INTRAVENOUS
  Administered 2017-07-31: 125 mL/h via INTRAVENOUS
  Administered 2017-07-31: 12:00:00 via INTRAVENOUS

## 2017-07-30 MED ORDER — LIDOCAINE HCL (PF) 1 % IJ SOLN
INTRAMUSCULAR | Status: AC
  Administered 2017-08-01: 30 mL via SUBCUTANEOUS
  Filled 2017-07-30: qty 30

## 2017-07-30 MED ORDER — ACETAMINOPHEN 325 MG PO TABS: 650.0000 mg | ORAL_TABLET | ORAL | Status: DC | PRN

## 2017-07-30 MED ORDER — CALCIUM CARBONATE ANTACID 500 MG PO CHEW: 2.0000 | CHEWABLE_TABLET | ORAL | Status: DC | PRN

## 2017-07-30 MED ORDER — OXYTOCIN 10 UNIT/ML IJ SOLN
INTRAMUSCULAR | Status: AC
  Filled 2017-07-30: qty 2

## 2017-07-30 MED ORDER — MISOPROSTOL 200 MCG PO TABS
ORAL_TABLET | ORAL | Status: AC
  Filled 2017-07-30: qty 4

## 2017-07-30 MED ORDER — ONDANSETRON HCL 4 MG/2ML IJ SOLN: 4.0000 mg | Freq: Four times a day (QID) | INTRAMUSCULAR | Status: DC | PRN

## 2017-07-30 MED ORDER — BUTORPHANOL TARTRATE 1 MG/ML IJ SOLN
1.0000 mg | INTRAMUSCULAR | Status: DC | PRN
  Administered 2017-07-31 (×3): 1 mg via INTRAVENOUS
  Filled 2017-07-30 (×2): qty 1

## 2017-07-30 MED ORDER — LACTATED RINGERS IV SOLN: 500.0000 mL | INTRAVENOUS | Status: DC | PRN

## 2017-07-30 NOTE — H&P (Signed)
Ilaisaane L Alycia RossettiKoch is a 21 y.o. female presenting for LOF for the past 20 hrs. OB History    Gravida Para Term Preterm AB Living   1             SAB TAB Ectopic Multiple Live Births                 No past medical history on file. No past surgical history on file. Family History: family history includes Asthma in her father and sister; Birth defects in her maternal grandfather; COPD in her mother; Cervical cancer in her mother; Diabetes in her father; Diabetic kidney disease in her father. Social History:  reports that she has never smoked. She has never used smokeless tobacco. She reports that she does not drink alcohol or use drugs.     Maternal Diabetes: No Genetic Screening: Normal Maternal Ultrasounds/Referrals: Normal Fetal Ultrasounds or other Referrals:  None Maternal Substance Abuse:  No Significant Maternal Medications:  None Significant Maternal Lab Results:  None Other Comments:  None  ROS History Dilation: 2 Effacement (%): 80 Station: -1 Exam by:: TFH Blood pressure 119/65, pulse 94, temperature 98.3 F (36.8 C), temperature source Oral, resp. rate 16, height 5\' 2"  (1.575 m), weight 87.1 kg (192 lb). Exam Physical Exam  Lungs CTA  CV RRR   abd : gravid   AGA  cx exam by RN as above   EFM : reactive , no decels , infrequent CTX   Prenatal labs: ABO, Rh:  O+ Antibody:  neg  Rubella:  IMM  RPR:   neg HBsAg:   neg HIV:   neg  GBS:   neg   Assessment/Plan: Admit to L+D  Augmentation Pitocin     Ihor Austinhomas J Schermerhorn 07/30/2017, 7:53 PM

## 2017-07-31 ENCOUNTER — Inpatient Hospital Stay: Payer: Medicaid Other | Admitting: Anesthesiology

## 2017-07-31 ENCOUNTER — Encounter: Payer: Self-pay | Admitting: Anesthesiology

## 2017-07-31 MED ORDER — PHENYLEPHRINE 40 MCG/ML (10ML) SYRINGE FOR IV PUSH (FOR BLOOD PRESSURE SUPPORT)
80.0000 ug | PREFILLED_SYRINGE | INTRAVENOUS | Status: DC | PRN
  Filled 2017-07-31: qty 5

## 2017-07-31 MED ORDER — BUPIVACAINE HCL (PF) 0.25 % IJ SOLN
INTRAMUSCULAR | Status: DC | PRN
  Administered 2017-07-31: 10 mL via EPIDURAL

## 2017-07-31 MED ORDER — DIPHENHYDRAMINE HCL 50 MG/ML IJ SOLN: 12.5000 mg | INTRAMUSCULAR | Status: DC | PRN

## 2017-07-31 MED ORDER — FENTANYL 2.5 MCG/ML W/ROPIVACAINE 0.15% IN NS 100 ML EPIDURAL (ARMC)
EPIDURAL | Status: AC
  Administered 2017-07-31: 250 ug
  Filled 2017-07-31: qty 100

## 2017-07-31 MED ORDER — FENTANYL 2.5 MCG/ML W/ROPIVACAINE 0.15% IN NS 100 ML EPIDURAL (ARMC)
EPIDURAL | Status: DC | PRN
  Administered 2017-07-31: 12 mL/h via EPIDURAL

## 2017-07-31 MED ORDER — LIDOCAINE HCL (PF) 1 % IJ SOLN
INTRAMUSCULAR | Status: DC | PRN
  Administered 2017-07-31: 3 mL

## 2017-07-31 MED ORDER — LACTATED RINGERS IV SOLN: 500.0000 mL | Freq: Once | INTRAVENOUS | Status: DC

## 2017-07-31 MED ORDER — EPHEDRINE 5 MG/ML INJ
10.0000 mg | INTRAVENOUS | Status: DC | PRN
  Filled 2017-07-31: qty 2

## 2017-07-31 MED ORDER — LIDOCAINE-EPINEPHRINE (PF) 1.5 %-1:200000 IJ SOLN
INTRAMUSCULAR | Status: DC | PRN
  Administered 2017-07-31: 3 mL via PERINEURAL

## 2017-07-31 MED ORDER — FENTANYL 2.5 MCG/ML W/ROPIVACAINE 0.15% IN NS 100 ML EPIDURAL (ARMC)
12.0000 mL/h | EPIDURAL | Status: DC
  Filled 2017-07-31: qty 100

## 2017-07-31 NOTE — Progress Notes (Signed)
[name] [name] is a 21 y.o. G1P0 at 6055w5d by  Subjective: Cle working well SROM now 45 hrs . Pitocin at 9122mu/min   Objective: BP 110/60   Pulse 88   Temp 98 F (36.7 C) (Oral)   Resp 18   Ht 5\' 2"  (1.575 m)   Wt 87.1 kg (192 lb)   LMP 11/09/2016 (Exact Date)   SpO2 98%   BMI 35.12 kg/m  I/O last 3 completed shifts: In: 3347.4 [I.V.:3347.4] Out: -  No intake/output data recorded.  FHT:  FHR: 150 bpm, variability: moderate,  accelerations:  Present,  decelerations:  Absent UC:   regular, every 3-4 minutes SVE:   Dilation: 6.5 Effacement (%): 100 Station: 0 Exam by:: k. yates, rn Exam 5 cm / c/ 0 station  By TJS at 2115  IUPC placed  Labs: Lab Results  Component Value Date   WBC 10.4 07/30/2017   HGB 11.7 (L) 07/30/2017   HCT 34.5 (L) 07/30/2017   MCV 86.4 07/30/2017   PLT 267 07/30/2017    Assessment / Plan: prolong rupture of membranes . active phase of labor  Cont pitocin to gain adequate ctx pattern   [name] [name] 07/31/2017, 9:11 PM

## 2017-07-31 NOTE — Anesthesia Procedure Notes (Signed)
Epidural  Start time: 07/31/2017 5:45 PM End time: 07/31/2017 5:55 PM  Staffing Anesthesiologist: Yves DillARROLL, Irisa Grimsley Performed: anesthesiologist   Preanesthetic Checklist Completed: patient identified, site marked, surgical consent, pre-op evaluation, timeout performed, IV checked, risks and benefits discussed and monitors and equipment checked  Epidural Prep: Betadine and site prepped and draped Patient monitoring: cardiac monitor, continuous pulse ox, blood pressure and heart rate Approach: midline Location: L3-L4 Injection technique: LOR air  Needle:  Needle type: Tuohy  Needle gauge: 18 G Needle length: 9 cm Catheter type: closed end Catheter size: 20 Guage Test dose: negative and 1.5% lidocaine with Epi 1:200 K  Additional Notes Time out called.  Patient placed in sitting position and back prepped and draped in sterile fashion.  A skin wheal was made in the L3-L4 interspace with 1% Lidocaine plain.  Patient was very mobile and cautioned about being still.  An 18G Tuohy needle was advanced into the epidural space by a loss of resistance.  No blood or fluid noted.  The epidural catheter was advanced 3 cm into the space and the TD was negative.  Patient tolerated the procedure

## 2017-07-31 NOTE — Anesthesia Preprocedure Evaluation (Signed)
Anesthesia Evaluation  Patient identified by MRN, date of birth, ID band Patient awake    Reviewed: Allergy & Precautions, NPO status , Patient's Chart, lab work & pertinent test results  Airway Mallampati: II       Dental no notable dental hx.    Pulmonary neg pulmonary ROS,    Pulmonary exam normal        Cardiovascular negative cardio ROS Normal cardiovascular exam     Neuro/Psych negative neurological ROS  negative psych ROS   GI/Hepatic negative GI ROS, Neg liver ROS,   Endo/Other  negative endocrine ROS  Renal/GU negative Renal ROS  negative genitourinary   Musculoskeletal negative musculoskeletal ROS (+)   Abdominal   Peds negative pediatric ROS (+)  Hematology negative hematology ROS (+)   Anesthesia Other Findings   Reproductive/Obstetrics (+) Pregnancy                             Anesthesia Physical Anesthesia Plan  ASA: II  Anesthesia Plan: Epidural   Post-op Pain Management:    Induction:   PONV Risk Score and Plan:   Airway Management Planned: Natural Airway  Additional Equipment:   Intra-op Plan:   Post-operative Plan:   Informed Consent: I have reviewed the patients History and Physical, chart, labs and discussed the procedure including the risks, benefits and alternatives for the proposed anesthesia with the patient or authorized representative who has indicated his/her understanding and acceptance.   Dental advisory given  Plan Discussed with: CRNA and Surgeon  Anesthesia Plan Comments:         Anesthesia Quick Evaluation

## 2017-07-31 NOTE — Progress Notes (Signed)
[name] [name] is a 21 y.o. G1P0 at 7944w5d by PROM  On 20 mu/min Pit  Objective: BP 106/63 (BP Location: Left Arm)   Pulse 85   Temp 97.7 F (36.5 C) (Oral)   Resp 16   Ht 5\' 2"  (1.575 m)   Wt 87.1 kg (192 lb)   LMP 11/09/2016 (Exact Date)   BMI 35.12 kg/m  No intake/output data recorded. No intake/output data recorded.  FHT:  Reassuring fetal monitoring UC:   regular, every 3 minutes SVE:   Dilation: 1.5 Effacement (%): 90 Station: 0 Exam by:: TJS  Labs: Lab Results  Component Value Date   WBC 10.4 07/30/2017   HGB 11.7 (L) 07/30/2017   HCT 34.5 (L) 07/30/2017   MCV 86.4 07/30/2017   PLT 267 07/30/2017    Assessment / Plan:  Prom on pitocin .  Cont augmentation  [name] [name] 07/31/2017, 8:15 AM

## 2017-08-01 LAB — RPR: RPR Ser Ql: NONREACTIVE

## 2017-08-01 LAB — CBC
HEMATOCRIT: 30.6 % — AB (ref 35.0–47.0)
Hemoglobin: 10.3 g/dL — ABNORMAL LOW (ref 12.0–16.0)
MCH: 28.5 pg (ref 26.0–34.0)
MCHC: 33.5 g/dL (ref 32.0–36.0)
MCV: 85 fL (ref 80.0–100.0)
Platelets: 246 10*3/uL (ref 150–440)
RBC: 3.6 MIL/uL — ABNORMAL LOW (ref 3.80–5.20)
RDW: 13.7 % (ref 11.5–14.5)
WBC: 17.1 10*3/uL — ABNORMAL HIGH (ref 3.6–11.0)

## 2017-08-01 MED ORDER — IBUPROFEN 600 MG PO TABS
600.0000 mg | ORAL_TABLET | Freq: Four times a day (QID) | ORAL | Status: DC
  Administered 2017-08-01: 600 mg via ORAL

## 2017-08-01 MED ORDER — IBUPROFEN 600 MG PO TABS
600.0000 mg | ORAL_TABLET | Freq: Four times a day (QID) | ORAL | Status: DC
  Administered 2017-08-01 – 2017-08-03 (×9): 600 mg via ORAL
  Filled 2017-08-01 (×8): qty 1

## 2017-08-01 MED ORDER — SIMETHICONE 80 MG PO CHEW: 80.0000 mg | CHEWABLE_TABLET | ORAL | Status: DC | PRN

## 2017-08-01 MED ORDER — HYDROCODONE-ACETAMINOPHEN 5-325 MG PO TABS
1.0000 | ORAL_TABLET | ORAL | Status: DC | PRN
  Administered 2017-08-02 – 2017-08-03 (×4): 1 via ORAL
  Filled 2017-08-01 (×4): qty 1

## 2017-08-01 MED ORDER — PSYLLIUM 95 % PO PACK
1.0000 | PACK | Freq: Every day | ORAL | Status: DC
  Administered 2017-08-01 – 2017-08-03 (×3): 1 via ORAL
  Filled 2017-08-01 (×3): qty 1

## 2017-08-01 MED ORDER — DIBUCAINE 1 % RE OINT
1.0000 "application " | TOPICAL_OINTMENT | RECTAL | Status: DC | PRN
  Administered 2017-08-02 – 2017-08-03 (×3): 1 via RECTAL
  Filled 2017-08-01 (×5): qty 28

## 2017-08-01 MED ORDER — COCONUT OIL OIL: 1.0000 "application " | TOPICAL_OIL | Status: DC | PRN

## 2017-08-01 MED ORDER — FERROUS SULFATE 325 (65 FE) MG PO TABS
ORAL_TABLET | ORAL | Status: AC
  Administered 2017-08-01: 09:00:00
  Filled 2017-08-01: qty 1

## 2017-08-01 MED ORDER — MEASLES, MUMPS & RUBELLA VAC ~~LOC~~ INJ
0.5000 mL | INJECTION | Freq: Once | SUBCUTANEOUS | Status: DC
  Filled 2017-08-01: qty 0.5

## 2017-08-01 MED ORDER — SENNOSIDES-DOCUSATE SODIUM 8.6-50 MG PO TABS
2.0000 | ORAL_TABLET | ORAL | Status: DC
  Administered 2017-08-02 – 2017-08-03 (×2): 2 via ORAL
  Filled 2017-08-01 (×2): qty 2

## 2017-08-01 MED ORDER — WITCH HAZEL-GLYCERIN EX PADS
1.0000 "application " | MEDICATED_PAD | CUTANEOUS | Status: DC | PRN
  Administered 2017-08-03: 1 via TOPICAL
  Filled 2017-08-01 (×2): qty 100

## 2017-08-01 MED ORDER — BENZOCAINE-MENTHOL 20-0.5 % EX AERO
1.0000 "application " | INHALATION_SPRAY | CUTANEOUS | Status: DC | PRN
  Administered 2017-08-03: 1 via TOPICAL
  Filled 2017-08-01: qty 56

## 2017-08-01 MED ORDER — DIPHENHYDRAMINE HCL 25 MG PO CAPS: 25.0000 mg | ORAL_CAPSULE | Freq: Four times a day (QID) | ORAL | Status: DC | PRN

## 2017-08-01 MED ORDER — FENTANYL CITRATE (PF) 100 MCG/2ML IJ SOLN
INTRAMUSCULAR | Status: AC
  Filled 2017-08-01: qty 2

## 2017-08-01 MED ORDER — BENZOCAINE-MENTHOL 20-0.5 % EX AERO
INHALATION_SPRAY | CUTANEOUS | Status: AC
  Filled 2017-08-01: qty 56

## 2017-08-01 MED ORDER — ONDANSETRON HCL 4 MG PO TABS: 4.0000 mg | ORAL_TABLET | ORAL | Status: DC | PRN

## 2017-08-01 MED ORDER — FERROUS SULFATE 325 (65 FE) MG PO TABS
325.0000 mg | ORAL_TABLET | Freq: Two times a day (BID) | ORAL | Status: DC
  Administered 2017-08-01 – 2017-08-03 (×5): 325 mg via ORAL
  Filled 2017-08-01 (×4): qty 1

## 2017-08-01 MED ORDER — PRENATAL MULTIVITAMIN CH
1.0000 | ORAL_TABLET | Freq: Every day | ORAL | Status: DC
  Filled 2017-08-01 (×2): qty 1

## 2017-08-01 MED ORDER — DOCUSATE SODIUM 100 MG PO CAPS
100.0000 mg | ORAL_CAPSULE | Freq: Two times a day (BID) | ORAL | Status: DC
  Administered 2017-08-01 – 2017-08-03 (×4): 100 mg via ORAL
  Filled 2017-08-01 (×4): qty 1

## 2017-08-01 MED ORDER — MAGNESIUM HYDROXIDE 400 MG/5ML PO SUSP: 30.0000 mL | ORAL | Status: DC | PRN

## 2017-08-01 MED ORDER — FENTANYL CITRATE (PF) 100 MCG/2ML IJ SOLN
50.0000 ug | Freq: Once | INTRAMUSCULAR | Status: AC
  Administered 2017-08-01: 50 ug via INTRAVENOUS

## 2017-08-01 MED ORDER — IBUPROFEN 600 MG PO TABS
ORAL_TABLET | ORAL | Status: AC
  Administered 2017-08-01: 10:00:00
  Filled 2017-08-01: qty 1

## 2017-08-01 MED ORDER — ZOLPIDEM TARTRATE 5 MG PO TABS: 5.0000 mg | ORAL_TABLET | Freq: Every evening | ORAL | Status: DC | PRN

## 2017-08-01 MED ORDER — ACETAMINOPHEN 325 MG PO TABS
650.0000 mg | ORAL_TABLET | ORAL | Status: DC | PRN
Start: 2017-08-01 — End: 2017-08-03
  Administered 2017-08-03: 650 mg via ORAL
  Filled 2017-08-01: qty 2

## 2017-08-01 MED ORDER — ONDANSETRON HCL 4 MG/2ML IJ SOLN: 4.0000 mg | INTRAMUSCULAR | Status: DC | PRN

## 2017-08-01 MED ORDER — IBUPROFEN 600 MG PO TABS
ORAL_TABLET | ORAL | Status: AC
  Filled 2017-08-01: qty 1

## 2017-08-01 NOTE — Op Note (Signed)
NAME:  [name] [name], [name]                      ACCOUNT NO.:  MEDICAL RECORD NO.:  [id number]  LOCATION:                                 FACILITY:  PHYSICIAN:  Jennell Cornerhomas Schermerhorn, MD     DATE OF BIRTH:  DATE OF PROCEDURE:  08/01/2017 DATE OF DISCHARGE:                              OPERATIVE REPORT   PREOPERATIVE DIAGNOSIS: 1. Prolonged rupture of membranes. 2. Maternal exhaustion.  POSTOPERATIVE DIAGNOSIS: 1. Prolonged rupture of membranes. 2. Maternal exhaustion. 3. Partial third-degree anal sphincter laceration.  PROCEDURE PERFORMED: 1. Outlet vacuum vaginal delivery. 2. Repair of partial third-degree laceration.  SURGEON:  Jennell Cornerhomas Schermerhorn, MD  INDICATION:  A 21 year old, gravida 1, para 0 patient with prolonged rupture of membranes at 50 hours.  The patient was pushing adequately for over 2-1/2 hours and brought the fetal head to the outlet position. The patient was unable to deliver on her own.  I discussed the role of a vacuum-assisted vaginal delivery, which the patient and husband agreed to.  DESCRIPTION OF PROCEDURE:  A midline episiotomy was made and a Kiwi bell vacuum was applied to the occiput of the fetal head.  Three sets of pulls allowed for delivery of the head.  There was only 1 pop-off. Fetal head and shoulders were delivered without difficulty and a vigorous female was delivered at 0310 on August 01, 2017.  Apgars 8 and 9. Placenta was delivered without difficulty.  There was noted to be a 60% tear in the internal and external anal sphincter muscle after delivery of the placenta.  The capsule edges were grasped with Allis clamps and the perineum was injected copiously with 1% lidocaine.  The internal and external sphincter muscles were repaired with 3 figure-of-eight sutures of 2-0 Vicryl.  Good approximation of the sphincter.  A rectal exam was done before the repair and post repair with good repair without any defects noted.  The rest of the midline  episiotomy was repaired in the standard fashion.  The patient tolerated the procedure well.  There were no complications.         ______________________________ Jennell Cornerhomas Schermerhorn, MD    TS/MEDQ  D:  08/01/2017  T:  08/01/2017  Job:  [id number]

## 2017-08-01 NOTE — Discharge Summary (Signed)
Obstetrical Discharge Summary  Patient Name: [name] [name] DOB: 09/26/1996 MRN: [id number]  Date of Admission: 07/30/2017  Date of discharge : 08/03/2017  Date of Delivery: 08/01/17 @0310 (manually enter date) Delivered by: Schermerhorn   Primary OB: [facility] YQM:[id number]:Patient's last menstrual period was 11/09/2016 (exact date). EDC Estimated Date of Delivery: 08/16/17 Gestational Age at Delivery: 338w6d   Antepartum complications:none Admitting Diagnosis: PROM  Secondary Diagnosis:prolong rupture of membranes  Patient Active Problem List   Diagnosis Date Noted  . Decreased fetal movement 07/30/2017  . Premature rupture of membranes 07/30/2017  . Influenza A 01/09/2017    Augmentation: Pitocin Complications: ROM>24 hours Intrapartum complications/course: partial third degree laceration  Date of Delivery: 08/01/17 at 0310  Delivered By: schermerhorn MD Delivery Type: vacuum, outlet Anesthesia: epidural Placenta: sponatneous Laceration: partial third Episiotomy: none Newborn Data: Live born unspecified sex  Birth Weight:  6/11 APGAR: , 8/9    Postpartum Procedures: none  Post partum course:  Patient had an uncomplicated postpartum course.  By time of discharge on PPD#2, her pain was controlled on oral pain medications; she had appropriate lochia and was ambulating, voiding without difficulty and tolerating regular diet.  She was deemed stable for discharge to home.    Discharge Physical Exam: 08/03/2017  BP (!) 114/59 (BP Location: Right Arm)   Pulse 79   Temp 97.8 F (36.6 C) (Oral)   Resp 18   Ht 5\' 2"  (1.575 m)   Wt 87.1 kg (192 lb)   LMP 11/09/2016 (Exact Date)   SpO2 100%   BMI 35.12 kg/m   General: NAD CV: RRR Pulm: CTABL, nl effort ABD: s/nd/nt, fundus firm and below the umbilicus Lochia: moderate Incision: DVT Evaluation: LE non-ttp, no evidence of DVT on exam.  Hemoglobin  Date Value Ref Range Status  08/02/2017 9.9 (L) 12.0 - 16.0 g/dL  Final   HCT  Date Value Ref Range Status  08/02/2017 30.2 (L) 35.0 - 47.0 % Final     Disposition: stable, discharge to home. Baby Feeding: breastmilk Baby Disposition: home with mom  Rh Immune globulin given:  Rubella vaccine given:  Tdap vaccine given in AP or PP setting:  Flu vaccine given in AP or PP setting:   Contraception: condoms  Prenatal Labs:     Plan:  [name] [name] was discharged to home in good condition. Follow-up appointment with delivering provider in 6 weeks.  Discharge Medications: Allergies as of 08/03/2017      Reactions   Sulfa Antibiotics    Childhood allergy, do not know      Medication List    STOP taking these medications   oseltamivir 75 MG capsule Commonly known as:  TAMIFLU     TAKE these medications   albuterol 108 (90 Base) MCG/ACT inhaler Commonly known as:  PROVENTIL HFA;VENTOLIN HFA Inhale 2 puffs into the lungs every 6 (six) hours as needed for wheezing or shortness of breath.   benzocaine-Menthol 20-0.5 % Aero Commonly known as:  DERMOPLAST Apply 1 application topically as needed for irritation (perineal discomfort).   docusate sodium 100 MG capsule Commonly known as:  COLACE Take 1 capsule (100 mg total) by mouth 2 (two) times daily.   HYDROcodone-acetaminophen 5-325 MG tablet Commonly known as:  NORCO/VICODIN Take 1 tablet by mouth every 4 (four) hours as needed for moderate pain.   ibuprofen 600 MG tablet Commonly known as:  ADVIL,MOTRIN Take 1 tablet (600 mg total) by mouth every 6 (six) hours.   multivitamin tablet Take  1 tablet by mouth daily.       Follow-up Information    Schermerhorn, Ihor Austin, MD Follow up in 6 week(s).   Specialty:  Obstetrics and Gynecology Contact information: [address] [place] [address] [phone]           Signed: Ihor Austin Schermerhorn  MD  08/03/2017    Hit refresh and delete this line

## 2017-08-02 LAB — CBC
HCT: 30.2 % — ABNORMAL LOW (ref 35.0–47.0)
Hemoglobin: 9.9 g/dL — ABNORMAL LOW (ref 12.0–16.0)
MCH: 29.1 pg (ref 26.0–34.0)
MCHC: 32.8 g/dL (ref 32.0–36.0)
MCV: 88.9 fL (ref 80.0–100.0)
PLATELETS: 256 10*3/uL (ref 150–440)
RBC: 3.4 MIL/uL — ABNORMAL LOW (ref 3.80–5.20)
RDW: 13.9 % (ref 11.5–14.5)
WBC: 11.2 10*3/uL — AB (ref 3.6–11.0)

## 2017-08-02 NOTE — Anesthesia Postprocedure Evaluation (Signed)
Anesthesia Post Note  Patient: [name] [name]  Procedure(s) Performed: * No procedures listed *  Patient location during evaluation: Mother Baby Anesthesia Type: Epidural Level of consciousness: awake, awake and alert and oriented Pain management: pain level controlled Vital Signs Assessment: post-procedure vital signs reviewed and stable Respiratory status: spontaneous breathing, nonlabored ventilation and respiratory function stable Cardiovascular status: blood pressure returned to baseline and stable Postop Assessment: no headache and no backache Anesthetic complications: no     Last Vitals:  Vitals:   08/01/17 1928 08/02/17 0155  BP: (!) 106/54 (!) 104/56  Pulse: 72 74  Resp: 18 20  Temp: 36.7 C (!) 36.4 C  SpO2: 98% 96%    Last Pain:  Vitals:   08/02/17 0155  TempSrc: Oral  PainSc:                  Ginger Carne

## 2017-08-03 MED ORDER — BENZOCAINE-MENTHOL 20-0.5 % EX AERO: 1.0000 "application " | INHALATION_SPRAY | CUTANEOUS | 1 refills | Status: DC | PRN

## 2017-08-03 MED ORDER — HYDROCODONE-ACETAMINOPHEN 5-325 MG PO TABS: 1.0000 | ORAL_TABLET | ORAL | 0 refills | Status: DC | PRN

## 2017-08-03 MED ORDER — DOCUSATE SODIUM 100 MG PO CAPS: 100.0000 mg | ORAL_CAPSULE | Freq: Two times a day (BID) | ORAL | 0 refills | Status: DC

## 2017-08-03 MED ORDER — IBUPROFEN 600 MG PO TABS: 600.0000 mg | ORAL_TABLET | Freq: Four times a day (QID) | ORAL | 0 refills | Status: DC

## 2017-08-03 NOTE — Progress Notes (Signed)
Patient discharged home with infant. Discharge instructions, prescriptions and follow up appointment given to and reviewed with patient. Patient verbalized understanding. Patient wheeled out via tech with infant.

## 2017-08-05 LAB — SURGICAL PATHOLOGY

## 2017-12-17 DIAGNOSIS — K2970 Gastritis, unspecified, without bleeding: Secondary | ICD-10-CM

## 2017-12-17 HISTORY — DX: Gastritis, unspecified, without bleeding: K29.70

## 2018-08-05 ENCOUNTER — Other Ambulatory Visit: Payer: Self-pay | Admitting: Nurse Practitioner

## 2018-08-05 DIAGNOSIS — R1012 Left upper quadrant pain: Secondary | ICD-10-CM

## 2018-08-12 HISTORY — PX: UPPER GI ENDOSCOPY: SHX6162

## 2018-08-25 ENCOUNTER — Ambulatory Visit: Payer: Self-pay

## 2018-10-07 ENCOUNTER — Ambulatory Visit: Payer: 59 | Admitting: General Surgery

## 2018-10-07 ENCOUNTER — Encounter: Payer: Self-pay | Admitting: General Surgery

## 2018-10-07 ENCOUNTER — Other Ambulatory Visit: Payer: Self-pay

## 2018-10-07 VITALS — BP 96/56 | HR 70 | Resp 12 | Ht 62.0 in | Wt 163.0 lb

## 2018-10-07 DIAGNOSIS — K8020 Calculus of gallbladder without cholecystitis without obstruction: Secondary | ICD-10-CM | POA: Diagnosis not present

## 2018-10-07 NOTE — Progress Notes (Signed)
Patient ID: [name] [name], female   DOB: 1996/11/29, 22 y.o.   MRN: [id number]  Chief Complaint  Patient presents with  . Abdominal Pain    HPI [name] [name] is a 22 y.o. female.  Here for evaluation of abdominal pain referred by Fransico Setters NP. She states she has a burning sensation upper abdomen only at night and not every night.  When the pain gets worse she has nausea, no vomiting. She states it is worse with spicy foods. She has limited/cut out all spicy foods and was getting better until last night and it happened. Last night the burning lasted from 3am til 430.  They have a 62 month old boy. During her pregnancy her pain was right upper quadrant.    Ultrasound was 07-08-18 at [facility] showing gallstones. Bowels move daily. She is a Associate Professor at [employer]. She is here with her husband, [name] [name] .  HPI  Past Medical History:  Diagnosis Date  . Gastritis 2019    Past Surgical History:  Procedure Laterality Date  . UPPER GI ENDOSCOPY  08/12/2018   Dr Mechele Collin    Family History  Problem Relation Age of Onset  . Cervical cancer Mother   . COPD Mother   . Diabetic kidney disease Father   . Diabetes Father   . Asthma Father   . Asthma Sister   . Birth defects Maternal Grandfather     Social History Social History   Tobacco Use  . Smoking status: Never Smoker  . Smokeless tobacco: Never Used  Substance Use Topics  . Alcohol use: No  . Drug use: No    Allergies  Allergen Reactions  . Sulfa Antibiotics     Childhood allergy, do not know    Current Outpatient Medications  Medication Sig Dispense Refill  . acetaminophen (TYLENOL) 325 MG tablet Take 650 mg by mouth every 6 (six) hours as needed.    Marland Kitchen omeprazole (PRILOSEC) 40 MG capsule Take by mouth 2 (two) times daily.     . sucralfate (CARAFATE) 1 g tablet Take by mouth 3 (three) times daily.      No current facility-administered medications for this visit.     Review of Systems Review of Systems   Constitutional: Negative.   Respiratory: Negative.   Cardiovascular: Negative.   Gastrointestinal: Positive for abdominal pain and nausea. Negative for constipation and diarrhea.    Blood pressure (!) 96/56, pulse 70, resp. rate 12, height 5\' 2"  (1.575 m), weight 163 lb (73.9 kg), last menstrual period 09/22/2018, SpO2 99 %, unknown if currently breastfeeding.  Physical Exam Physical Exam  Constitutional: She is oriented to person, place, and time. She appears well-developed and well-nourished.  HENT:  Mouth/Throat: Oropharynx is clear and moist.  Eyes: No scleral icterus.  Cardiovascular: Normal rate, regular rhythm and normal heart sounds.  Pulmonary/Chest: Effort normal and breath sounds normal.  Abdominal: Soft. Normal appearance and bowel sounds are normal. There is no tenderness.  Neurological: She is alert and oriented to person, place, and time.  Skin: Skin is warm and dry.  Psychiatric: Her behavior is normal.    Data Reviewed H pylori breast test positive July 08, 2018.  CBC dated August 04, 2018: Normal. Comprehensive metabolic panel of the same date: Normal.   August 04, 2018 office notes of Fransico Setters, NP reviewed.   EGD completed at [facility] on August 12, 2018 reviewed: Patchy inflammation in the gastric antrum.   Biopsy results: Mild chronic gastritis: Negative  for H pylori.   Referral note relates to a history of cholelthiasis. No record in [facility] or [facility] system of ultrasound being competed.   Assessment    Symptoms compatible with biliary colic, especially as she is on excellent therapy for mild gastritis.     Plan     If ultrasound confirmation of gallstones is confirmed, I think the patient would benefit from cholecystectomy. She has a 60 year life expectancy, and is interested in having additional children. Avoiding surgery during pregnancy would be ideal.   Laparoscopic Cholecystectomy with Intraoperative Cholangiogram. The procedure, including  it's potential risks and complications (including but not limited to infection, bleeding, injury to intra-abdominal organs or bile ducts, bile leak, poor cosmetic result, sepsis and death) were discussed with the patient in detail. Non-operative options, including their inherent risks (acute calculous cholecystitis with possible choledocholithiasis or gallstone pancreatitis, with the risk of ascending cholangitis, sepsis, and death) were discussed as well. The patient expressed and understanding of what we discussed and wishes to proceed with laparoscopic cholecystectomy. The patient further understands that if it is technically not possible, or it is unsafe to proceed laparoscopically, that I will convert to an open cholecystectomy.      HPI, Physical Exam, Assessment and Plan have been scribed under the direction and in the presence of Earline Mayotte, MD. Dorathy Daft, RN   Merrily Pew Ramondo Dietze 10/07/2018, 9:11 PM

## 2018-10-07 NOTE — Patient Instructions (Addendum)
The patient is aware to call back for any questions or new concerns. Patient will call back to schedule.    Laparoscopic Cholecystectomy Laparoscopic cholecystectomy is surgery to remove the gallbladder. The gallbladder is a pear-shaped organ that lies beneath the liver on the right side of the body. The gallbladder stores bile, which is a fluid that helps the body to digest fats. Cholecystectomy is often done for inflammation of the gallbladder (cholecystitis). This condition is usually caused by a buildup of gallstones (cholelithiasis) in the gallbladder. Gallstones can block the flow of bile, which can result in inflammation and pain. In severe cases, emergency surgery may be required. This procedure is done though small incisions in your abdomen (laparoscopic surgery). A thin scope with a camera (laparoscope) is inserted through one incision. Thin surgical instruments are inserted through the other incisions. In some cases, a laparoscopic procedure may be turned into a type of surgery that is done through a larger incision (open surgery). Tell a health care provider about:  Any allergies you have.  All medicines you are taking, including vitamins, herbs, eye drops, creams, and over-the-counter medicines.  Any problems you or family members have had with anesthetic medicines.  Any blood disorders you have.  Any surgeries you have had.  Any medical conditions you have.  Whether you are pregnant or may be pregnant. What are the risks? Generally, this is a safe procedure. However, problems may occur, including:  Infection.  Bleeding.  Allergic reactions to medicines.  Damage to other structures or organs.  A stone remaining in the common bile duct. The common bile duct carries bile from the gallbladder into the small intestine.  A bile leak from the cyst duct that is clipped when your gallbladder is removed.  What happens before the procedure? Staying hydrated Follow  instructions from your health care provider about hydration, which may include:  Up to 2 hours before the procedure - you may continue to drink clear liquids, such as water, clear fruit juice, black coffee, and plain tea.  Eating and drinking restrictions Follow instructions from your health care provider about eating and drinking, which may include:  8 hours before the procedure - stop eating heavy meals or foods such as meat, fried foods, or fatty foods.  6 hours before the procedure - stop eating light meals or foods, such as toast or cereal.  6 hours before the procedure - stop drinking milk or drinks that contain milk.  2 hours before the procedure - stop drinking clear liquids.  Medicines  Ask your health care provider about: ? Changing or stopping your regular medicines. This is especially important if you are taking diabetes medicines or blood thinners. ? Taking medicines such as aspirin and ibuprofen. These medicines can thin your blood. Do not take these medicines before your procedure if your health care provider instructs you not to.  You may be given antibiotic medicine to help prevent infection. General instructions  Let your health care provider know if you develop a cold or an infection before surgery.  Plan to have someone take you home from the hospital or clinic.  Ask your health care provider how your surgical site will be marked or identified. What happens during the procedure?  To reduce your risk of infection: ? Your health care team will wash or sanitize their hands. ? Your skin will be washed with soap. ? Hair may be removed from the surgical area.  An IV tube may be inserted into one  of your veins.  You will be given one or more of the following: ? A medicine to help you relax (sedative). ? A medicine to make you fall asleep (general anesthetic).  A breathing tube will be placed in your mouth.  Your surgeon will make several small cuts (incisions)  in your abdomen.  The laparoscope will be inserted through one of the small incisions. The camera on the laparoscope will send images to a TV screen (monitor) in the operating room. This lets your surgeon see inside your abdomen.  Air-like gas will be pumped into your abdomen. This will expand your abdomen to give the surgeon more room to perform the surgery.  Other tools that are needed for the procedure will be inserted through the other incisions. The gallbladder will be removed through one of the incisions.  Your common bile duct may be examined. If stones are found in the common bile duct, they may be removed.  After your gallbladder has been removed, the incisions will be closed with stitches (sutures), staples, or skin glue.  Your incisions may be covered with a bandage (dressing). The procedure may vary among health care providers and hospitals. What happens after the procedure?  Your blood pressure, heart rate, breathing rate, and blood oxygen level will be monitored until the medicines you were given have worn off.  You will be given medicines as needed to control your pain.  Do not drive for 24 hours if you were given a sedative. This information is not intended to replace advice given to you by your health care provider. Make sure you discuss any questions you have with your health care provider. Document Released: 12/03/2005 Document Revised: 06/24/2016 Document Reviewed: 05/21/2016 Elsevier Interactive Patient Education  2018 [employer].

## 2018-10-08 ENCOUNTER — Telehealth: Payer: Self-pay | Admitting: *Deleted

## 2018-10-08 NOTE — Telephone Encounter (Signed)
I called the patient to ask about the ultrasound, she states it was completed at the [facility]N Department. I called and they are faxing over report.

## 2018-10-08 NOTE — Telephone Encounter (Signed)
-----   Message from Earline Mayotte, MD sent at 10/07/2018  9:20 PM EDT ----- I can find no documentation of an ultrasound being completed in the [facility] or [facility] system.  See where this was done, as we need a report (minimum) or disc (ideal).

## 2018-11-12 ENCOUNTER — Telehealth: Payer: Self-pay | Admitting: *Deleted

## 2018-11-12 NOTE — Telephone Encounter (Signed)
Patient was contacted today and notified that we have the January 2020 surgery schedule available and she can get her gallbladder surgery scheduled at her convenience.   The patient states she is at work and needs to check dates and will call the office back to arrange.   A pre-op visit will be needed with Dr. Lemar LivingsByrnett.   We also need to see if patient's insurance will be the same starting 12-17-2018.

## 2018-11-12 NOTE — Telephone Encounter (Signed)
Patient's surgery has been scheduled for 01-05-2019 at [facility] with Dr. Lemar LivingsByrnett.  The patient will be contacted by the Pre-Admission Testing Department to complete a phone interview about one week prior to surgery.  Surgery instructions have been mailed to the patient.  Patient will come in for a pre-op visit on 12-25-18 at 4:45 pm with Dr. Lemar LivingsByrnett.   The patient is aware to call the office should she have further questions.

## 2018-12-03 ENCOUNTER — Other Ambulatory Visit: Payer: Self-pay | Admitting: General Surgery

## 2018-12-03 ENCOUNTER — Telehealth: Payer: Self-pay | Admitting: *Deleted

## 2018-12-03 DIAGNOSIS — K8020 Calculus of gallbladder without cholecystitis without obstruction: Secondary | ICD-10-CM

## 2018-12-03 NOTE — Telephone Encounter (Signed)
I contacted patient today to notify her that Dr. Lemar LivingsByrnett will be on leave the day she was scheduled for gallbladder surgery.   The patient was offered to have another surgeon do surgery the day she is scheduled or have done by Dr. Lemar LivingsByrnett at a later time.   Patient wishes to have surgery the same day. This will be scheduled with Dr. Aleen CampiPiscoya.   Per Mindi JunkerMarsha in Pre-admit, Dr. Rutherford NailByrnett's orders will be sufficient and Dr. Aleen CampiPiscoya will not need to enter a new set of orders. We just need to verify that the O.R. schedule has updated the physician.

## 2018-12-05 ENCOUNTER — Telehealth: Payer: Self-pay

## 2018-12-05 DIAGNOSIS — K8020 Calculus of gallbladder without cholecystitis without obstruction: Secondary | ICD-10-CM

## 2018-12-05 NOTE — Telephone Encounter (Signed)
Patient will need to be seen by Dr Aleen CampiPiscoya for a pre op visit prior to surgery scheduled for 01/05/19. She will need to have labs, CBC and CMP drawn prior. Unable to leave a message as her voicemail is full.

## 2018-12-05 NOTE — Telephone Encounter (Signed)
Spoke with patient and she will be seen in office by Dr Aleen CampiPiscoya on 12/26/18 at 9:45 am. She will have her lab work completed at [facility] that morning.

## 2018-12-25 ENCOUNTER — Ambulatory Visit: Payer: 59 | Admitting: General Surgery

## 2018-12-26 ENCOUNTER — Other Ambulatory Visit: Payer: Self-pay

## 2018-12-26 ENCOUNTER — Ambulatory Visit: Payer: BLUE CROSS/BLUE SHIELD | Admitting: Surgery

## 2018-12-26 ENCOUNTER — Other Ambulatory Visit
Admission: RE | Admit: 2018-12-26 | Discharge: 2018-12-26 | Disposition: A | Discharge status: Home / Self Care | Payer: BLUE CROSS/BLUE SHIELD | Source: Ambulatory Visit | Attending: Surgery | Admitting: Surgery

## 2018-12-26 ENCOUNTER — Encounter: Payer: Self-pay | Admitting: Surgery

## 2018-12-26 VITALS — BP 117/82 | HR 62 | Temp 98.0°F | Resp 15 | Ht 62.0 in | Wt 160.2 lb

## 2018-12-26 DIAGNOSIS — K8020 Calculus of gallbladder without cholecystitis without obstruction: Secondary | ICD-10-CM | POA: Insufficient documentation

## 2018-12-26 LAB — CBC WITH DIFFERENTIAL/PLATELET
Abs Immature Granulocytes: 0.01 10*3/uL (ref 0.00–0.07)
Basophils Absolute: 0 10*3/uL (ref 0.0–0.1)
Basophils Relative: 1 %
Eosinophils Absolute: 0.1 10*3/uL (ref 0.0–0.5)
Eosinophils Relative: 1 %
HCT: 42.2 % (ref 36.0–46.0)
Hemoglobin: 13.7 g/dL (ref 12.0–15.0)
Immature Granulocytes: 0 %
Lymphocytes Relative: 31 %
Lymphs Abs: 1.8 10*3/uL (ref 0.7–4.0)
MCH: 28.4 pg (ref 26.0–34.0)
MCHC: 32.5 g/dL (ref 30.0–36.0)
MCV: 87.6 fL (ref 80.0–100.0)
Monocytes Absolute: 0.5 10*3/uL (ref 0.1–1.0)
Monocytes Relative: 9 %
Neutro Abs: 3.4 10*3/uL (ref 1.7–7.7)
Neutrophils Relative %: 58 %
PLATELETS: 265 10*3/uL (ref 150–400)
RBC: 4.82 MIL/uL (ref 3.87–5.11)
RDW: 13.2 % (ref 11.5–15.5)
WBC: 5.8 10*3/uL (ref 4.0–10.5)
nRBC: 0 % (ref 0.0–0.2)

## 2018-12-26 LAB — COMPREHENSIVE METABOLIC PANEL
ALT: 17 U/L (ref 0–44)
ANION GAP: 7 (ref 5–15)
AST: 17 U/L (ref 15–41)
Albumin: 4.5 g/dL (ref 3.5–5.0)
Alkaline Phosphatase: 67 U/L (ref 38–126)
BILIRUBIN TOTAL: 0.6 mg/dL (ref 0.3–1.2)
BUN: 12 mg/dL (ref 6–20)
CO2: 25 mmol/L (ref 22–32)
Calcium: 9.3 mg/dL (ref 8.9–10.3)
Chloride: 104 mmol/L (ref 98–111)
Creatinine, Ser: 0.76 mg/dL (ref 0.44–1.00)
GFR calc Af Amer: >60 mL/min (ref >60)
GFR calc non Af Amer: >60 mL/min (ref >60)
GLUCOSE: 97 mg/dL (ref 70–99)
Potassium: 3.9 mmol/L (ref 3.5–5.1)
Sodium: 136 mmol/L (ref 135–145)
TOTAL PROTEIN: 7.8 g/dL (ref 6.5–8.1)

## 2018-12-26 NOTE — Progress Notes (Signed)
12/26/2018  History of Present Illness: [name] [name] is a 23 y.o. female presenting for preop visit for symptomatic cholelithiasis.  She reports that she's had multiple episodes of biliary colic and most are milder with some pain, but some are more severe with nausea as well.  She reports about 2-3 episodes per week.  Her pain is mostly epigastric, though there is some radiation to the left.  She also has gastritis and she had an EGD with Dr. Markham Jordan 07/2018 which showed gastritis.  She has been on her PPI regimen and still has been having flareups of her pain when eating greasy foods.  Now her pain also happens despite of low fat diet.  Currently denies any pain or nausea.  Past Medical History: Past Medical History:  Diagnosis Date  . Gastritis 2019     Past Surgical History: Past Surgical History:  Procedure Laterality Date  . UPPER GI ENDOSCOPY  08/12/2018   Dr Mechele Collin    Home Medications: Prior to Admission medications   Medication Sig Start Date End Date Taking? Authorizing Provider  acetaminophen (TYLENOL) 500 MG tablet Take 1,000 mg by mouth daily as needed for moderate pain or headache.   Yes [provider]  Ascorbic Acid (VITAMIN C PO) Take 1 capsule by mouth daily.   Yes [provider]  calcium carbonate (TUMS EX) 750 MG chewable tablet Chew 1-3 tablets by mouth daily as needed for heartburn.   Yes [provider]  omeprazole (PRILOSEC) 40 MG capsule Take 40 mg by mouth 2 (two) times daily.  09/22/18  Yes [provider]  sucralfate (CARAFATE) 1 g tablet Take 1 g by mouth 3 (three) times daily before meals.  08/12/18  Yes [provider]    Allergies: Allergies  Allergen Reactions  . Sulfa Antibiotics Other (See Comments)    Childhood allergy, do not know    Review of Systems: Review of Systems  Constitutional: Negative for chills and fever.  Respiratory: Negative for shortness of breath.   Cardiovascular: Negative for  chest pain.  Gastrointestinal: Negative for abdominal pain, nausea and vomiting.    Physical Exam BP 117/82   Pulse 62   Temp 98 F (36.7 C)   Resp 15   Ht 5\' 2"  (1.575 m)   Wt 160 lb 3.2 oz (72.7 kg)   LMP 12/10/2018 (Exact Date)   BMI 29.30 kg/m  CONSTITUTIONAL: No acute distress HEENT:  Normocephalic, atraumatic, extraocular motion intact. RESPIRATORY:  Lungs are clear, and breath sounds are equal bilaterally. Normal respiratory effort without pathologic use of accessory muscles. CARDIOVASCULAR: Heart is regular without murmurs, gallops, or rubs. GI: The abdomen is soft, non-distended, non-tender.  Negative Murphy's sign.  NEUROLOGIC:  Motor and sensation is grossly normal.  Cranial nerves are grossly intact. PSYCH:  Alert and oriented to person, place and time. Affect is normal.  Labs/Imaging: Labs 12/26/18: Sodium 136, potassium 3.9, chloride 104, CO2 25, BUN 12, creatinine 0.76.  Total bilirubin 0.6, AST 17, ALT 17, alkaline phosphatase 67.  WBC 5.8, hemoglobin 13.7, hematocrit 42.2, platelet count 265.  Ultrasound 07/08/2018: Cholelithiasis with at least 1 gallstone and a contracted gallbladder.  No evidence of biliary obstruction.  Assessment and Plan: This is a 23 y.o. female with symptomatic cholelithiasis.  Discussed with the patient that given her symptoms and no improvement after treatment for gastritis, the likelihood is that this is indeed gallbladder in etiology.  She was scheduled for surgery on 01/05/2019 with Dr. Doristine Counter but this is  currently unavailable I will be doing the surgery instead.  Given her normal LFTs on today's labs, will proceed with laparoscopic cholecystectomy.  I do not see a need at this point for a cholangiogram.  Discussed with the patient the risks of surgery including risk of bleeding, infection, injury to surrounding structures, and chance for converting to an open procedure, and she is willing to proceed.  Also discussed with her postoperative  outcomes and restrictions particularly no heavy lifting or pushing of anything more than 15 pounds for period of 4 weeks.  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  Face-to-face time spent with the patient and care providers was 15 minutes, with more than 50% of the time spent counseling, educating, and coordinating care of the patient.     Howie Ill, MD [facility]

## 2018-12-26 NOTE — Patient Instructions (Signed)
Surgery scheduled for 01/05/19.  No lifting of more than 15 pounds for 4 weeks afterwards.

## 2018-12-26 NOTE — H&P (View-Only) (Signed)
12/26/2018  History of Present Illness: [name] [name] is a 23 y.o. female presenting for preop visit for symptomatic cholelithiasis.  She reports that she's had multiple episodes of biliary colic and most are milder with some pain, but some are more severe with nausea as well.  She reports about 2-3 episodes per week.  Her pain is mostly epigastric, though there is some radiation to the left.  She also has gastritis and she had an EGD with Dr. Elliot 07/2018 which showed gastritis.  She has been on her PPI regimen and still has been having flareups of her pain when eating greasy foods.  Now her pain also happens despite of low fat diet.  Currently denies any pain or nausea.  Past Medical History: Past Medical History:  Diagnosis Date  . Gastritis 2019     Past Surgical History: Past Surgical History:  Procedure Laterality Date  . UPPER GI ENDOSCOPY  08/12/2018   Dr Elliott    Home Medications: Prior to Admission medications   Medication Sig Start Date End Date Taking? Authorizing Provider  acetaminophen (TYLENOL) 500 MG tablet Take 1,000 mg by mouth daily as needed for moderate pain or headache.   Yes [provider]  Ascorbic Acid (VITAMIN C PO) Take 1 capsule by mouth daily.   Yes [provider]  calcium carbonate (TUMS EX) 750 MG chewable tablet Chew 1-3 tablets by mouth daily as needed for heartburn.   Yes [provider]  omeprazole (PRILOSEC) 40 MG capsule Take 40 mg by mouth 2 (two) times daily.  09/22/18  Yes [provider]  sucralfate (CARAFATE) 1 g tablet Take 1 g by mouth 3 (three) times daily before meals.  08/12/18  Yes [provider]    Allergies: Allergies  Allergen Reactions  . Sulfa Antibiotics Other (See Comments)    Childhood allergy, do not know    Review of Systems: Review of Systems  Constitutional: Negative for chills and fever.  Respiratory: Negative for shortness of breath.   Cardiovascular: Negative for  chest pain.  Gastrointestinal: Negative for abdominal pain, nausea and vomiting.    Physical Exam BP 117/82   Pulse 62   Temp 98 F (36.7 C)   Resp 15   Ht 5' 2" (1.575 m)   Wt 160 lb 3.2 oz (72.7 kg)   LMP 12/10/2018 (Exact Date)   BMI 29.30 kg/m  CONSTITUTIONAL: No acute distress HEENT:  Normocephalic, atraumatic, extraocular motion intact. RESPIRATORY:  Lungs are clear, and breath sounds are equal bilaterally. Normal respiratory effort without pathologic use of accessory muscles. CARDIOVASCULAR: Heart is regular without murmurs, gallops, or rubs. GI: The abdomen is soft, non-distended, non-tender.  Negative Murphy's sign.  NEUROLOGIC:  Motor and sensation is grossly normal.  Cranial nerves are grossly intact. PSYCH:  Alert and oriented to person, place and time. Affect is normal.  Labs/Imaging: Labs 12/26/18: Sodium 136, potassium 3.9, chloride 104, CO2 25, BUN 12, creatinine 0.76.  Total bilirubin 0.6, AST 17, ALT 17, alkaline phosphatase 67.  WBC 5.8, hemoglobin 13.7, hematocrit 42.2, platelet count 265.  Ultrasound 07/08/2018: Cholelithiasis with at least 1 gallstone and a contracted gallbladder.  No evidence of biliary obstruction.  Assessment and Plan: This is a 23 y.o. female with symptomatic cholelithiasis.  Discussed with the patient that given her symptoms and no improvement after treatment for gastritis, the likelihood is that this is indeed gallbladder in etiology.  She was scheduled for surgery on 01/05/2019 with Dr. Burnett but this is   currently unavailable I will be doing the surgery instead.  Given her normal LFTs on today's labs, will proceed with laparoscopic cholecystectomy.  I do not see a need at this point for a cholangiogram.  Discussed with the patient the risks of surgery including risk of bleeding, infection, injury to surrounding structures, and chance for converting to an open procedure, and she is willing to proceed.  Also discussed with her postoperative  outcomes and restrictions particularly no heavy lifting or pushing of anything more than 15 pounds for period of 4 weeks.  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  Face-to-face time spent with the patient and care providers was 15 minutes, with more than 50% of the time spent counseling, educating, and coordinating care of the patient.     Howie Ill, MD [facility]

## 2018-12-29 ENCOUNTER — Encounter
Admission: RE | Admit: 2018-12-29 | Discharge: 2018-12-29 | Disposition: A | Discharge status: Home / Self Care | Payer: BLUE CROSS/BLUE SHIELD | Source: Ambulatory Visit | Attending: Surgery | Admitting: Surgery

## 2018-12-29 ENCOUNTER — Encounter: Payer: Self-pay | Admitting: *Deleted

## 2018-12-29 ENCOUNTER — Other Ambulatory Visit: Payer: Self-pay

## 2018-12-29 HISTORY — DX: Headache: R51

## 2018-12-29 HISTORY — DX: Abnormal results of liver function studies: R94.5

## 2018-12-29 HISTORY — DX: Other specified abnormal findings of blood chemistry: R79.89

## 2018-12-29 HISTORY — DX: Headache, unspecified: R51.9

## 2018-12-29 HISTORY — DX: Family history of other specified conditions: Z84.89

## 2018-12-29 NOTE — Patient Instructions (Signed)
Your procedure is scheduled on: 01-05-19  Report to Same Day Surgery 2nd floor medical mall [facility] Entrance-take elevator on left to 2nd floor.  Check in with surgery information desk.) To find out your arrival time please call [phone] between 1PM - 3PM on 01-02-19   Remember: Instructions that are not followed completely may result in serious medical risk, up to and including death, or upon the discretion of your surgeon and anesthesiologist your surgery may need to be rescheduled.    _x___ 1. Do not eat food after midnight the night before your procedure. You may drink clear liquids up to 2 hours before you are scheduled to arrive at the hospital for your procedure.  Do not drink clear liquids within 2 hours of your scheduled arrival to the hospital.  Clear liquids include  --Water or Apple juice without pulp  --Clear carbohydrate beverage such as ClearFast or Gatorade  --Black Coffee or Clear Tea (No milk, no creamers, do not add anything to the coffee or Tea   ____Ensure clear carbohydrate drink on the way to the hospital for bariatric patients  ____Ensure clear carbohydrate drink 3 hours before surgery for Dr Rutherford Nail patients if physician instructed.   No gum chewing or hard candies.     __x__ 2. No Alcohol for 24 hours before or after surgery.   __x__3. No Smoking or e-cigarettes for 24 prior to surgery.  Do not use any chewable tobacco products for at least 6 hour prior to surgery   ____  4. Bring all medications with you on the day of surgery if instructed.    __x__ 5. Notify your doctor if there is any change in your medical condition     (cold, fever, infections).    x___6. On the morning of surgery brush your teeth with toothpaste and water.  You may rinse your mouth with mouth wash if you wish.  Do not swallow any toothpaste or mouthwash.   Do not wear jewelry, make-up, hairpins, clips or nail polish.  Do not wear lotions, powders, or perfumes. You may wear  deodorant.  Do not shave 48 hours prior to surgery. Men may shave face and neck.  Do not bring valuables to the hospital.    [facility] is not responsible for any belongings or valuables.               Contacts, dentures or bridgework may not be worn into surgery.  Leave your suitcase in the car. After surgery it may be brought to your room.  For patients admitted to the hospital, discharge time is determined by your treatment team.  _  Patients discharged the day of surgery will not be allowed to drive home.  You will need someone to drive you home and stay with you the night of your procedure.    Please read over the following fact sheets that you were given:   [facility] Preparing for Surgery  _x___ TAKE THE FOLLOWING MEDICATION THE MORNING OF SURGERY WITH A SMALL SIP OF WATER. These include:  1. OMEPRAZOLE  2.  3.  4.  5.  6.  ____Fleets enema or Magnesium Citrate as directed.   ____ Use CHG Soap or sage wipes as directed on instruction sheet   ____ Use inhalers on the day of surgery and bring to hospital day of surgery  ____ Stop Metformin and Janumet 2 days prior to surgery.    ____ Take 1/2 of usual insulin dose the night before  surgery and none on the morning     surgery.   ____ Follow recommendations from Cardiologist, Pulmonologist or PCP regarding stopping Aspirin, Coumadin, Plavix ,Eliquis, Effient, or Pradaxa, and Pletal.  X____Stop Anti-inflammatories such as Advil, Aleve, Ibuprofen, Motrin, Naproxen, Naprosyn, Goodies powders or aspirin products NOW-OK to take Tylenol    _x___ Stop supplements until after surgery-STOP VITAMIN C NOW-MAY RESUME AFTER SURGERY   ____ Bring C-Pap to the hospital.

## 2019-01-04 MED ORDER — CEFAZOLIN SODIUM-DEXTROSE 2-4 GM/100ML-% IV SOLN
2.0000 g | INTRAVENOUS | Status: AC
  Administered 2019-01-05: 2 g via INTRAVENOUS

## 2019-01-05 ENCOUNTER — Encounter: Payer: Self-pay | Admitting: *Deleted

## 2019-01-05 ENCOUNTER — Ambulatory Visit: Payer: BLUE CROSS/BLUE SHIELD | Admitting: Anesthesiology

## 2019-01-05 ENCOUNTER — Other Ambulatory Visit: Payer: Self-pay

## 2019-01-05 ENCOUNTER — Ambulatory Visit
Admission: RE | Admit: 2019-01-05 | Discharge: 2019-01-05 | Disposition: A | Discharge status: Home / Self Care | Payer: BLUE CROSS/BLUE SHIELD | Attending: Surgery | Admitting: Surgery

## 2019-01-05 ENCOUNTER — Encounter
Admission: RE | Disposition: A | Discharge status: Home / Self Care | Payer: Self-pay | Source: Home / Self Care | Attending: Surgery

## 2019-01-05 DIAGNOSIS — K8020 Calculus of gallbladder without cholecystitis without obstruction: Secondary | ICD-10-CM

## 2019-01-05 DIAGNOSIS — Z79899 Other long term (current) drug therapy: Secondary | ICD-10-CM | POA: Insufficient documentation

## 2019-01-05 DIAGNOSIS — K8012 Calculus of gallbladder with acute and chronic cholecystitis without obstruction: Secondary | ICD-10-CM | POA: Diagnosis not present

## 2019-01-05 HISTORY — PX: CHOLECYSTECTOMY: SHX55

## 2019-01-05 LAB — POCT PREGNANCY, URINE: Preg Test, Ur: NEGATIVE

## 2019-01-05 SURGERY — LAPAROSCOPIC CHOLECYSTECTOMY
Anesthesia: General

## 2019-01-05 MED ORDER — CHLORHEXIDINE GLUCONATE CLOTH 2 % EX PADS: 6.0000 | MEDICATED_PAD | Freq: Once | CUTANEOUS | Status: DC

## 2019-01-05 MED ORDER — ONDANSETRON HCL 4 MG/2ML IJ SOLN
INTRAMUSCULAR | Status: DC | PRN
  Administered 2019-01-05: 4 mg via INTRAVENOUS

## 2019-01-05 MED ORDER — KETOROLAC TROMETHAMINE 30 MG/ML IJ SOLN
INTRAMUSCULAR | Status: AC
  Filled 2019-01-05: qty 1

## 2019-01-05 MED ORDER — BUPIVACAINE-EPINEPHRINE (PF) 0.25% -1:200000 IJ SOLN
INTRAMUSCULAR | Status: DC | PRN
  Administered 2019-01-05: 30 mL

## 2019-01-05 MED ORDER — OXYCODONE HCL 5 MG PO TABS: 5.0000 mg | ORAL_TABLET | ORAL | 0 refills | Status: DC | PRN

## 2019-01-05 MED ORDER — OXYCODONE HCL 5 MG PO TABS
ORAL_TABLET | ORAL | Status: AC
  Filled 2019-01-05: qty 1

## 2019-01-05 MED ORDER — FENTANYL CITRATE (PF) 100 MCG/2ML IJ SOLN
INTRAMUSCULAR | Status: DC | PRN
  Administered 2019-01-05: 50 ug via INTRAVENOUS
  Administered 2019-01-05: 100 ug via INTRAVENOUS

## 2019-01-05 MED ORDER — FENTANYL CITRATE (PF) 100 MCG/2ML IJ SOLN
INTRAMUSCULAR | Status: AC
  Filled 2019-01-05: qty 2

## 2019-01-05 MED ORDER — CEFAZOLIN SODIUM-DEXTROSE 2-4 GM/100ML-% IV SOLN
INTRAVENOUS | Status: AC
  Filled 2019-01-05: qty 100

## 2019-01-05 MED ORDER — SUGAMMADEX SODIUM 200 MG/2ML IV SOLN
INTRAVENOUS | Status: DC | PRN
  Administered 2019-01-05: 200 mg via INTRAVENOUS

## 2019-01-05 MED ORDER — MIDAZOLAM HCL 2 MG/2ML IJ SOLN
INTRAMUSCULAR | Status: DC | PRN
  Administered 2019-01-05: 2 mg via INTRAVENOUS

## 2019-01-05 MED ORDER — GABAPENTIN 300 MG PO CAPS
300.0000 mg | ORAL_CAPSULE | ORAL | Status: AC
  Administered 2019-01-05: 300 mg via ORAL

## 2019-01-05 MED ORDER — ROCURONIUM BROMIDE 100 MG/10ML IV SOLN
INTRAVENOUS | Status: DC | PRN
  Administered 2019-01-05: 10 mg via INTRAVENOUS
  Administered 2019-01-05: 40 mg via INTRAVENOUS

## 2019-01-05 MED ORDER — PROPOFOL 10 MG/ML IV BOLUS
INTRAVENOUS | Status: AC
  Filled 2019-01-05: qty 20

## 2019-01-05 MED ORDER — PROPOFOL 10 MG/ML IV BOLUS
INTRAVENOUS | Status: DC | PRN
  Administered 2019-01-05: 150 mg via INTRAVENOUS

## 2019-01-05 MED ORDER — GABAPENTIN 300 MG PO CAPS
ORAL_CAPSULE | ORAL | Status: AC
  Filled 2019-01-05: qty 1

## 2019-01-05 MED ORDER — FENTANYL CITRATE (PF) 250 MCG/5ML IJ SOLN
INTRAMUSCULAR | Status: AC
  Filled 2019-01-05: qty 5

## 2019-01-05 MED ORDER — OXYCODONE HCL 5 MG PO TABS
5.0000 mg | ORAL_TABLET | Freq: Once | ORAL | Status: AC | PRN
  Administered 2019-01-05: 5 mg via ORAL

## 2019-01-05 MED ORDER — MIDAZOLAM HCL 2 MG/2ML IJ SOLN
INTRAMUSCULAR | Status: AC
  Filled 2019-01-05: qty 2

## 2019-01-05 MED ORDER — BUPIVACAINE-EPINEPHRINE (PF) 0.25% -1:200000 IJ SOLN
INTRAMUSCULAR | Status: AC
  Filled 2019-01-05: qty 30

## 2019-01-05 MED ORDER — OXYCODONE HCL 5 MG/5ML PO SOLN: 5.0000 mg | Freq: Once | ORAL | Status: AC | PRN

## 2019-01-05 MED ORDER — ACETAMINOPHEN 500 MG PO TABS
1000.0000 mg | ORAL_TABLET | ORAL | Status: AC
  Administered 2019-01-05: 1000 mg via ORAL

## 2019-01-05 MED ORDER — SUCCINYLCHOLINE CHLORIDE 20 MG/ML IJ SOLN
INTRAMUSCULAR | Status: AC
  Filled 2019-01-05: qty 1

## 2019-01-05 MED ORDER — IBUPROFEN 600 MG PO TABS: 600.0000 mg | ORAL_TABLET | Freq: Three times a day (TID) | ORAL | 0 refills | Status: AC | PRN

## 2019-01-05 MED ORDER — LACTATED RINGERS IV SOLN
INTRAVENOUS | Status: DC
  Administered 2019-01-05 (×2): via INTRAVENOUS

## 2019-01-05 MED ORDER — LIDOCAINE HCL (CARDIAC) PF 100 MG/5ML IV SOSY
PREFILLED_SYRINGE | INTRAVENOUS | Status: DC | PRN
  Administered 2019-01-05: 80 mg via INTRAVENOUS

## 2019-01-05 MED ORDER — ACETAMINOPHEN 500 MG PO TABS
ORAL_TABLET | ORAL | Status: AC
  Filled 2019-01-05: qty 2

## 2019-01-05 MED ORDER — FENTANYL CITRATE (PF) 100 MCG/2ML IJ SOLN
25.0000 ug | INTRAMUSCULAR | Status: DC | PRN
  Administered 2019-01-05 (×2): 25 ug via INTRAVENOUS
  Administered 2019-01-05: 50 ug via INTRAVENOUS
  Administered 2019-01-05: 25 ug via INTRAVENOUS
  Administered 2019-01-05: 50 ug via INTRAVENOUS

## 2019-01-05 MED ORDER — KETOROLAC TROMETHAMINE 30 MG/ML IJ SOLN
30.0000 mg | Freq: Once | INTRAMUSCULAR | Status: AC
  Administered 2019-01-05: 30 mg via INTRAVENOUS

## 2019-01-05 MED ORDER — DEXAMETHASONE SODIUM PHOSPHATE 10 MG/ML IJ SOLN
INTRAMUSCULAR | Status: DC | PRN
  Administered 2019-01-05: 8 mg via INTRAVENOUS

## 2019-01-05 MED ORDER — EPHEDRINE SULFATE 50 MG/ML IJ SOLN
INTRAMUSCULAR | Status: DC | PRN
  Administered 2019-01-05: 10 mg via INTRAVENOUS

## 2019-01-05 SURGICAL SUPPLY — 46 items
"PENCIL ELECTRO HAND CTR " (MISCELLANEOUS) ×1 IMPLANT
APPLIER CLIP 5 13 M/L LIGAMAX5 (MISCELLANEOUS) ×3
BLADE SURG 15 STRL LF DISP TIS (BLADE) ×1 IMPLANT
BLADE SURG 15 STRL SS (BLADE) ×2
CANISTER SUCT 1200ML W/VALVE (MISCELLANEOUS) ×3 IMPLANT
CATH CHOLANGI 4FR 420404F (CATHETERS) ×2 IMPLANT
CHLORAPREP W/TINT 26ML (MISCELLANEOUS) ×3 IMPLANT
CLIP APPLIE 5 13 M/L LIGAMAX5 (MISCELLANEOUS) ×1 IMPLANT
CONRAY 60ML FOR OR (MISCELLANEOUS) ×1 IMPLANT
COVER WAND RF STERILE (DRAPES) ×3 IMPLANT
DERMABOND ADVANCED (GAUZE/BANDAGES/DRESSINGS) ×2
DERMABOND ADVANCED .7 DNX12 (GAUZE/BANDAGES/DRESSINGS) ×1 IMPLANT
DRAPE C-ARM XRAY 36X54 (DRAPES) IMPLANT
ELECT REM PT RETURN 9FT ADLT (ELECTROSURGICAL) ×3
ELECTRODE REM PT RTRN 9FT ADLT (ELECTROSURGICAL) ×1 IMPLANT
FILTER LAP SMOKE EVAC STRL (MISCELLANEOUS) ×1 IMPLANT
GLOVE SURG SYN 7.0 (GLOVE) ×3 IMPLANT
GLOVE SURG SYN 7.0 PF PI (GLOVE) ×1 IMPLANT
GLOVE SURG SYN 7.5  E (GLOVE) ×2
GLOVE SURG SYN 7.5 E (GLOVE) ×1 IMPLANT
GLOVE SURG SYN 7.5 PF PI (GLOVE) ×1 IMPLANT
GOWN STRL REUS W/ TWL LRG LVL3 (GOWN DISPOSABLE) ×3 IMPLANT
GOWN STRL REUS W/TWL LRG LVL3 (GOWN DISPOSABLE) ×6
IRRIGATION STRYKERFLOW (MISCELLANEOUS) IMPLANT
IRRIGATOR STRYKERFLOW (MISCELLANEOUS)
IV CATH ANGIO 12GX3 LT BLUE (NEEDLE) ×1 IMPLANT
IV NS 1000ML (IV SOLUTION)
IV NS 1000ML BAXH (IV SOLUTION) IMPLANT
JACKSON PRATT 10 (INSTRUMENTS) IMPLANT
L-HOOK LAP DISP 36CM (ELECTROSURGICAL) ×3
LABEL OR SOLS (LABEL) ×3 IMPLANT
LHOOK LAP DISP 36CM (ELECTROSURGICAL) ×1 IMPLANT
NEEDLE HYPO 22GX1.5 SAFETY (NEEDLE) ×6 IMPLANT
PACK LAP CHOLECYSTECTOMY (MISCELLANEOUS) ×3 IMPLANT
PENCIL ELECTRO HAND CTR (MISCELLANEOUS) ×3 IMPLANT
POUCH SPECIMEN RETRIEVAL 10MM (ENDOMECHANICALS) ×3 IMPLANT
SCISSORS METZENBAUM CVD 33 (INSTRUMENTS) ×3 IMPLANT
SLEEVE ADV FIXATION 5X100MM (TROCAR) ×9 IMPLANT
SPONGE VERSALON 4X4 4PLY (MISCELLANEOUS) IMPLANT
SUT MNCRL 4-0 (SUTURE) ×2
SUT MNCRL 4-0 27XMFL (SUTURE) ×1
SUT VICRYL 0 AB UR-6 (SUTURE) ×3 IMPLANT
SUTURE MNCRL 4-0 27XMF (SUTURE) ×1 IMPLANT
TROCAR BALLN GELPORT 12X130M (ENDOMECHANICALS) ×3 IMPLANT
TROCAR Z-THREAD OPTICAL 5X100M (TROCAR) ×3 IMPLANT
TUBING INSUFFLATION (TUBING) ×3 IMPLANT

## 2019-01-05 NOTE — Transfer of Care (Signed)
Immediate Anesthesia Transfer of Care Note  Patient: [name] [name]  Procedure(s) Performed: LAPAROSCOPIC CHOLECYSTECTOMY (N/A )  Patient Location: PACU  Anesthesia Type:General  Level of Consciousness: drowsy and patient cooperative  Airway & Oxygen Therapy: Patient Spontanous Breathing and Patient connected to nasal cannula oxygen  Post-op Assessment: Report given to RN and Post -op Vital signs reviewed and stable  Post vital signs: Reviewed and stable  Last Vitals:  Vitals Value Taken Time  BP 119/60 01/05/2019 12:22 PM  Temp 36.1 C 01/05/2019 12:22 PM  Pulse 91 01/05/2019 12:27 PM  Resp 16 01/05/2019 12:27 PM  SpO2 100 % 01/05/2019 12:27 PM  Vitals shown include unvalidated device data.  Last Pain:  Vitals:   01/05/19 0906  TempSrc: Temporal  PainSc: 0-No pain         Complications: No apparent anesthesia complications

## 2019-01-05 NOTE — Anesthesia Postprocedure Evaluation (Signed)
Anesthesia Post Note  Patient: [name] [name]  Procedure(s) Performed: LAPAROSCOPIC CHOLECYSTECTOMY (N/A )  Patient location during evaluation: PACU Anesthesia Type: General Level of consciousness: awake and alert Pain management: pain level controlled Vital Signs Assessment: post-procedure vital signs reviewed and stable Respiratory status: spontaneous breathing, nonlabored ventilation, respiratory function stable and patient connected to nasal cannula oxygen Cardiovascular status: blood pressure returned to baseline and stable Postop Assessment: no apparent nausea or vomiting Anesthetic complications: no     Last Vitals:  Vitals:   01/05/19 1337 01/05/19 1415  BP: (!) 99/55 (!) 93/54  Pulse:  87  Resp: 15 16  Temp:  (!) 36.3 C  SpO2:  100%    Last Pain:  Vitals:   01/05/19 1415  TempSrc:   PainSc: Asleep                 Cleda Mccreedy Piscitello

## 2019-01-05 NOTE — Op Note (Signed)
  Procedure Date:  01/05/2019  Pre-operative Diagnosis:  Symptomatic cholelithiasis  Post-operative Diagnosis:  Symptomatic cholelithiasis  Procedure:  Laparoscopic cholecystectomy  Surgeon:  Howie Ill, MD  Assistant:  Lynden Oxford, PA-C  Anesthesia:  General endotracheal  Estimated Blood Loss:  10 ml  Specimens:  gallbladder  Complications:  None  Indications for Procedure:  This is a 23 y.o. female who presents with abdominal pain and workup revealing symptomatic cholelithiasis.  The benefits, complications, treatment options, and expected outcomes were discussed with the patient. The risks of bleeding, infection, recurrence of symptoms, failure to resolve symptoms, bile duct damage, bile duct leak, retained common bile duct stone, bowel injury, and need for further procedures were all discussed with the patient and she was willing to proceed.  Description of Procedure: The patient was correctly identified in the preoperative area and brought into the operating room.  The patient was placed supine with VTE prophylaxis in place.  Appropriate time-outs were performed.  Anesthesia was induced and the patient was intubated.  Appropriate antibiotics were infused.  The abdomen was prepped and draped in a sterile fashion. An infraumbilical incision was made. A cutdown technique was used to enter the abdominal cavity without injury, and a Hasson trocar was inserted.  Pneumoperitoneum was obtained with appropriate opening pressures.  A 5-mm port was placed in the subxiphoid area and two 5-mm ports were placed in the right upper quadrant under direct visualization.  The gallbladder was identified.  The fundus was grasped and retracted cephalad.  Adhesions were lysed bluntly and with electrocautery. The infundibulum was grasped and retracted laterally, exposing the peritoneum overlying the gallbladder.  This was incised with electrocautery and extended on either side of the gallbladder.   The cystic duct and cystic artery were clearly identified and bluntly dissected.  Both were clipped twice proximally and once distally, cutting in between.  The gallbladder was taken from the gallbladder fossa in a retrograde fashion with electrocautery. The gallbladder was placed in an Endocatch bag. The liver bed was inspected and any bleeding was controlled with electrocautery. The right upper quadrant was then inspected again revealing intact clips, no bleeding, and no ductal injury.  The area was thoroughly irrigated.  The 5 mm ports were removed under direct visualization and the Hasson trocar was removed.  The Endocatch bag was brought out via the umbilical incision. The fascial opening was closed using 0 vicryl suture.  Local anesthetic was infused in all incisions and the incisions were closed with 4-0 Monocryl.  The wounds were cleaned and sealed with DermaBond.  The patient was emerged from anesthesia and extubated and brought to the recovery room for further management.  The patient tolerated the procedure well and all counts were correct at the end of the case.   Howie Ill, MD

## 2019-01-05 NOTE — Anesthesia Post-op Follow-up Note (Signed)
Anesthesia QCDR form completed.        

## 2019-01-05 NOTE — Anesthesia Procedure Notes (Signed)
Procedure Name: Intubation Date/Time: 01/05/2019 11:00 AM Performed by: Andria Frames, MD Pre-anesthesia Checklist: Patient identified, Patient being monitored, Timeout performed, Emergency Drugs available and Suction available Patient Re-evaluated:Patient Re-evaluated prior to induction Oxygen Delivery Method: Circle system utilized Preoxygenation: Pre-oxygenation with 100% oxygen Induction Type: IV induction Ventilation: Mask ventilation without difficulty Laryngoscope Size: Mac and 3 Grade View: Grade I Tube type: Oral Tube size: 7.0 mm Number of attempts: 1 Airway Equipment and Method: Stylet Placement Confirmation: ETT inserted through vocal cords under direct vision,  positive ETCO2 and breath sounds checked- equal and bilateral Secured at: 21 cm Tube secured with: Tape Dental Injury: Teeth and Oropharynx as per pre-operative assessment

## 2019-01-05 NOTE — Anesthesia Preprocedure Evaluation (Signed)
Anesthesia Evaluation  Patient identified by MRN, date of birth, ID band Patient awake    Reviewed: Allergy & Precautions, H&P , NPO status , Patient's Chart, lab work & pertinent test results  History of Anesthesia Complications (+) Family history of anesthesia reaction and history of anesthetic complications  Airway Mallampati: III  TM Distance: <3 FB Neck ROM: full    Dental  (+) Chipped   Pulmonary neg pulmonary ROS, neg shortness of breath,           Cardiovascular Exercise Tolerance: Good (-) angina(-) Past MI and (-) DOE      Neuro/Psych  Headaches, negative psych ROS   GI/Hepatic negative GI ROS, Neg liver ROS, neg GERD  ,  Endo/Other  negative endocrine ROS  Renal/GU      Musculoskeletal   Abdominal   Peds  Hematology negative hematology ROS (+)   Anesthesia Other Findings Past Medical History: No date: Elevated LFTs No date: Family history of adverse reaction to anesthesia     Comment:  BROTHER-BECAME VERY HYPER AFTER SURGERY 2019: Gastritis No date: Headache     Comment:  MIGRAINES  Past Surgical History: 08/12/2018: UPPER GI ENDOSCOPY     Comment:  Dr Mechele Collin  BMI    Body Mass Index:  29.30 kg/m      Reproductive/Obstetrics negative OB ROS                             Anesthesia Physical Anesthesia Plan  ASA: II  Anesthesia Plan: General ETT   Post-op Pain Management:    Induction: Intravenous  PONV Risk Score and Plan: Ondansetron, Dexamethasone, Midazolam and Treatment may vary due to age or medical condition  Airway Management Planned: Oral ETT  Additional Equipment:   Intra-op Plan:   Post-operative Plan: Extubation in OR  Informed Consent: I have reviewed the patients History and Physical, chart, labs and discussed the procedure including the risks, benefits and alternatives for the proposed anesthesia with the patient or authorized representative  who has indicated his/her understanding and acceptance.     Dental Advisory Given  Plan Discussed with: Anesthesiologist, CRNA and Surgeon  Anesthesia Plan Comments: (Patient consented for risks of anesthesia including but not limited to:  - adverse reactions to medications - damage to teeth, lips or other oral mucosa - sore throat or hoarseness - Damage to heart, brain, lungs or loss of life  Patient voiced understanding.)        Anesthesia Quick Evaluation

## 2019-01-05 NOTE — Interval H&P Note (Signed)
History and Physical Interval Note:  01/05/2019 10:17 AM  [name] [name]  has presented today for surgery, with the diagnosis of GALLSTONES  The various methods of treatment have been discussed with the patient and family. After consideration of risks, benefits and other options for treatment, the patient has consented to  Procedure(s): LAPAROSCOPIC CHOLECYSTECTOMY (N/A) as a surgical intervention .  The patient's history has been reviewed, patient examined, no change in status, stable for surgery.  I have reviewed the patient's chart and labs.  Questions were answered to the patient's satisfaction.     Rane Blitch

## 2019-01-06 ENCOUNTER — Encounter: Payer: Self-pay | Admitting: Surgery

## 2019-01-07 LAB — SURGICAL PATHOLOGY

## 2019-01-21 ENCOUNTER — Other Ambulatory Visit: Payer: Self-pay

## 2019-01-21 ENCOUNTER — Encounter: Payer: Self-pay | Admitting: Surgery

## 2019-01-21 ENCOUNTER — Ambulatory Visit (INDEPENDENT_AMBULATORY_CARE_PROVIDER_SITE_OTHER): Payer: BLUE CROSS/BLUE SHIELD | Admitting: Surgery

## 2019-01-21 VITALS — BP 105/71 | HR 80 | Temp 97.7°F | Ht 62.0 in | Wt 161.0 lb

## 2019-01-21 DIAGNOSIS — K8020 Calculus of gallbladder without cholecystitis without obstruction: Secondary | ICD-10-CM

## 2019-01-21 DIAGNOSIS — Z09 Encounter for follow-up examination after completed treatment for conditions other than malignant neoplasm: Secondary | ICD-10-CM

## 2019-01-21 NOTE — Progress Notes (Signed)
01/21/2019  HPI: [name] [name] is a 23 y.o. female s/p laparoscopic cholecystectomy on 01/05/19.  She presents for follow up.  Denies any complications.  Reports pain is resolved, denies any nausea or vomiting, and is tolerating a regular diet.  Denies any issues with the incisions except for some mild itching.  Vital signs: BP 105/71   Pulse 80   Temp 97.7 F (36.5 C) (Temporal)   Ht 5\' 2"  (1.575 m)   Wt 161 lb (73 kg)   SpO2 98%   BMI 29.45 kg/m    Physical Exam: Constitutional: No acute distress Abdomen:  Soft, non-distended, non-tender to palpation.  Incisions are clean, dry, intact, with no evidence of infection.  Assessment/Plan: This is a 23 y.o. 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 --Patient still has two more weeks of no heavy lifting or pushing.  She may resume normal activities afterwards. --May follow up prn.   Howie Ill, MD [facility]

## 2019-01-21 NOTE — Patient Instructions (Addendum)
Patient is to return to the office as needed.  Call the office with any questions or concerns. 

## 2020-10-31 LAB — OB RESULTS CONSOLE HEPATITIS B SURFACE ANTIGEN
Hepatitis B Surface Ag: NEGATIVE
Hepatitis B Surface Ag: NEGATIVE

## 2020-10-31 LAB — OB RESULTS CONSOLE HIV ANTIBODY (ROUTINE TESTING): HIV: NONREACTIVE

## 2020-10-31 LAB — OB RESULTS CONSOLE RUBELLA ANTIBODY, IGM
Rubella: IMMUNE
Rubella: IMMUNE
Rubella: IMMUNE
Rubella: IMMUNE

## 2020-10-31 LAB — OB RESULTS CONSOLE RPR: RPR: NONREACTIVE

## 2020-10-31 LAB — OB RESULTS CONSOLE VARICELLA ZOSTER ANTIBODY, IGG
Varicella: NON-IMMUNE/NOT IMMUNE
Varicella: NON-IMMUNE/NOT IMMUNE
Varicella: NON-IMMUNE/NOT IMMUNE
Varicella: NON-IMMUNE/NOT IMMUNE

## 2020-11-14 ENCOUNTER — Ambulatory Visit: Payer: BLUE CROSS/BLUE SHIELD

## 2020-11-21 ENCOUNTER — Ambulatory Visit: Payer: Medicaid Other

## 2020-12-17 NOTE — L&D Delivery Note (Signed)
Delivery Note  First Stage: Labor onset: 1300 Augmentation: pitocin, AROM forebag Analgesia /Anesthesia intrapartum: none SROM at 1300  Second Stage: Complete dilation at 2055 Onset of pushing at 2055 FHR second stage Cat II- prolonged decel x to 90bpm, maternal position changed.   Delivery of a viable female infant on 05/13/21 at 2102 by CNM delivery of fetal head in LOA position with restitution to LOP. Tight nuchal cord x 1;  Anterior then posterior shoulders delivered easily with gentle downward traction. Baby placed on mom's chest, and attended to by peds.  Cord double clamped after cessation of pulsation, cut by FOB Cord blood sample collected   Third Stage: Placenta delivered spontaneously intact with [facility] @ 2109 Placenta disposition: routine disposal Uterine tone Firm / bleeding small  2nd deg perineal laceration identified  Anesthesia for repair: local Repair in usual fashion to achieve cosmesis and hemostasis, 2-0 Vicryl CT-1 Est. Blood Loss (mL): 150  Complications: none  Mom to postpartum.  Baby to Couplet care / Skin to Skin.  Newborn: Birth Weight: pending  Apgar Scores: 8/9 Feeding planned: breast and formula.

## 2021-04-17 LAB — OB RESULTS CONSOLE GBS: GBS: NEGATIVE

## 2021-04-17 LAB — OB RESULTS CONSOLE GC/CHLAMYDIA
Chlamydia: NEGATIVE
Gonorrhea: NEGATIVE

## 2021-04-17 LAB — OB RESULTS CONSOLE RPR: RPR: NONREACTIVE

## 2021-04-17 LAB — OB RESULTS CONSOLE HIV ANTIBODY (ROUTINE TESTING): HIV: NONREACTIVE

## 2021-04-20 ENCOUNTER — Other Ambulatory Visit: Payer: Self-pay

## 2021-04-20 ENCOUNTER — Observation Stay
Admission: EM | Admit: 2021-04-20 | Discharge: 2021-04-20 | Disposition: A | Discharge status: Home / Self Care | Payer: BC Managed Care – PPO | Attending: Obstetrics and Gynecology | Admitting: Obstetrics and Gynecology

## 2021-04-20 DIAGNOSIS — O2693 Pregnancy related conditions, unspecified, third trimester: Secondary | ICD-10-CM | POA: Diagnosis present

## 2021-04-20 DIAGNOSIS — Z3A36 36 weeks gestation of pregnancy: Secondary | ICD-10-CM | POA: Insufficient documentation

## 2021-04-20 DIAGNOSIS — Z3490 Encounter for supervision of normal pregnancy, unspecified, unspecified trimester: Secondary | ICD-10-CM

## 2021-04-20 LAB — WET PREP, GENITAL
Clue Cells Wet Prep HPF POC: NONE SEEN
Sperm: NONE SEEN
Trich, Wet Prep: NONE SEEN
WBC, Wet Prep HPF POC: NONE SEEN
Yeast Wet Prep HPF POC: NONE SEEN

## 2021-04-20 LAB — URINALYSIS, ROUTINE W REFLEX MICROSCOPIC
Bilirubin Urine: NEGATIVE
Glucose, UA: NEGATIVE mg/dL
Hgb urine dipstick: NEGATIVE
Ketones, ur: NEGATIVE mg/dL
Nitrite: NEGATIVE
Protein, ur: NEGATIVE mg/dL
Specific Gravity, Urine: 1.013 (ref 1.005–1.030)
WBC, UA: >50 WBC/hpf — ABNORMAL HIGH (ref 0–5)
pH: 7 (ref 5.0–8.0)

## 2021-04-20 NOTE — OB Triage Note (Signed)
Pt G2P1 [redacted]w[redacted]d presents to [facility] via ED w/ c/o abdominal and back pain that started earlier today, thinking they are ctx. No LOF, or abnormal discharge, no vag bleeding, + fetal movement. VSS. Monitors applied and assessing, initial fetal heart tones in 140's.

## 2021-04-20 NOTE — OB Triage Note (Signed)
Pt discharged home in good condition with significant other. RN at bedside to go over discharge instructions, pt verbalized understanding to come back with increased ctx, LOF, vag bleeding, decreased fetal movement ect.

## 2021-04-20 NOTE — Discharge Summary (Signed)
TRIAGE VISIT with NST   [name] [name] is a 25 y.o. G2P1. She is at [redacted]w[redacted]d gestation, presenting with signs of labor.  Indication: back pain  S: Resting comfortably. no CTX, no VB. Active fetal movement. Concerned about lower abdominal pain, improved.  O:  BP 116/61 (BP Location: Left Arm)   Pulse 88   Temp 98.1 F (36.7 C) (Oral)   Resp 18   LMP 08/08/2020  Results for orders placed or performed during the hospital encounter of 04/20/21 (from the past 48 hour(s))  Wet prep, genital   Collection Time: 04/20/21  1:25 AM   Specimen: Cervix  Result Value Ref Range   Yeast Wet Prep HPF POC NONE SEEN NONE SEEN   Trich, Wet Prep NONE SEEN NONE SEEN   Clue Cells Wet Prep HPF POC NONE SEEN NONE SEEN   WBC, Wet Prep HPF POC NONE SEEN NONE SEEN   Sperm NONE SEEN   Urinalysis, Routine w reflex microscopic Urine, Clean Catch   Collection Time: 04/20/21  1:25 AM  Result Value Ref Range   Color, Urine YELLOW (A) YELLOW   APPearance HAZY (A) CLEAR   Specific Gravity, Urine 1.013 1.005 - 1.030   pH 7.0 5.0 - 8.0   Glucose, UA NEGATIVE NEGATIVE mg/dL   Hgb urine dipstick NEGATIVE NEGATIVE   Bilirubin Urine NEGATIVE NEGATIVE   Ketones, ur NEGATIVE NEGATIVE mg/dL   Protein, ur NEGATIVE NEGATIVE mg/dL   Nitrite NEGATIVE NEGATIVE   Leukocytes,Ua LARGE (A) NEGATIVE   RBC / HPF 0-5 0 - 5 RBC/hpf   WBC, UA >50 (H) 0 - 5 WBC/hpf   Bacteria, UA RARE (A) NONE SEEN   Squamous Epithelial / LPF 6-10 0 - 5   Mucus PRESENT      NST/FHT: 130/mod var/+accels/no decels TOCO: quiet LKG:MWNUUVOZ: 1 Effacement (%): 60 Cervical Position: Middle Station: Ballotable Presentation: Vertex Exam by:: Anastasia Pall RN  NST: Reactive. See FHT above for particulars.  A/P:  25 y.o. G2P1 [redacted]w[redacted]d with concerns for preterm labor.   Labor: not present.   R/o ROM: SSE negative x 3. Wet prep neg. Normal amount of white discharge.   Fetal Wellbeing: NST reactive Reassuring Cat 1 tracing.  D/c home stable,  precautions reviewed, follow-up as scheduled.

## 2021-05-12 ENCOUNTER — Other Ambulatory Visit
Admission: RE | Admit: 2021-05-12 | Discharge: 2021-05-12 | Disposition: A | Discharge status: Home / Self Care | Payer: Medicaid Other | Source: Ambulatory Visit | Attending: Obstetrics and Gynecology | Admitting: Obstetrics and Gynecology

## 2021-05-12 ENCOUNTER — Other Ambulatory Visit: Payer: Self-pay

## 2021-05-12 DIAGNOSIS — Z20822 Contact with and (suspected) exposure to covid-19: Secondary | ICD-10-CM | POA: Insufficient documentation

## 2021-05-12 DIAGNOSIS — Z01812 Encounter for preprocedural laboratory examination: Secondary | ICD-10-CM | POA: Insufficient documentation

## 2021-05-12 LAB — SARS CORONAVIRUS 2 (TAT 6-24 HRS): SARS Coronavirus 2: NEGATIVE

## 2021-05-13 ENCOUNTER — Encounter: Payer: Self-pay | Admitting: Obstetrics and Gynecology

## 2021-05-13 ENCOUNTER — Inpatient Hospital Stay
Admission: EM | Admit: 2021-05-13 | Discharge: 2021-05-14 | DRG: 806 | Disposition: A | Discharge status: Home / Self Care | Payer: Medicaid Other | Attending: Obstetrics and Gynecology | Admitting: Obstetrics and Gynecology

## 2021-05-13 ENCOUNTER — Other Ambulatory Visit: Payer: Self-pay

## 2021-05-13 DIAGNOSIS — O9081 Anemia of the puerperium: Secondary | ICD-10-CM | POA: Diagnosis not present

## 2021-05-13 DIAGNOSIS — Z20822 Contact with and (suspected) exposure to covid-19: Secondary | ICD-10-CM | POA: Diagnosis present

## 2021-05-13 DIAGNOSIS — O26893 Other specified pregnancy related conditions, third trimester: Secondary | ICD-10-CM | POA: Diagnosis present

## 2021-05-13 DIAGNOSIS — Z3A39 39 weeks gestation of pregnancy: Secondary | ICD-10-CM

## 2021-05-13 DIAGNOSIS — D62 Acute posthemorrhagic anemia: Secondary | ICD-10-CM | POA: Diagnosis not present

## 2021-05-13 LAB — TYPE AND SCREEN
ABO/RH(D): O POS
Antibody Screen: NEGATIVE

## 2021-05-13 LAB — CBC
HCT: 34.8 % — ABNORMAL LOW (ref 36.0–46.0)
Hemoglobin: 11.6 g/dL — ABNORMAL LOW (ref 12.0–15.0)
MCH: 29.1 pg (ref 26.0–34.0)
MCHC: 33.3 g/dL (ref 30.0–36.0)
MCV: 87.2 fL (ref 80.0–100.0)
Platelets: 252 10*3/uL (ref 150–400)
RBC: 3.99 MIL/uL (ref 3.87–5.11)
RDW: 14 % (ref 11.5–15.5)
WBC: 8.5 10*3/uL (ref 4.0–10.5)
nRBC: 0 % (ref 0.0–0.2)

## 2021-05-13 MED ORDER — ZOLPIDEM TARTRATE 5 MG PO TABS: 5.0000 mg | ORAL_TABLET | Freq: Every evening | ORAL | Status: DC | PRN

## 2021-05-13 MED ORDER — SIMETHICONE 80 MG PO CHEW: 80.0000 mg | CHEWABLE_TABLET | ORAL | Status: DC | PRN

## 2021-05-13 MED ORDER — MISOPROSTOL 200 MCG PO TABS
ORAL_TABLET | ORAL | Status: AC
  Filled 2021-05-13: qty 3

## 2021-05-13 MED ORDER — LACTATED RINGERS IV SOLN: INTRAVENOUS | Status: DC

## 2021-05-13 MED ORDER — LACTATED RINGERS IV SOLN: 500.0000 mL | INTRAVENOUS | Status: DC | PRN

## 2021-05-13 MED ORDER — OXYTOCIN-SODIUM CHLORIDE 30-0.9 UT/500ML-% IV SOLN: 1.0000 m[IU]/min | INTRAVENOUS | Status: DC

## 2021-05-13 MED ORDER — OXYTOCIN 10 UNIT/ML IJ SOLN
INTRAMUSCULAR | Status: AC
  Filled 2021-05-13: qty 2

## 2021-05-13 MED ORDER — AMMONIA AROMATIC IN INHA
RESPIRATORY_TRACT | Status: AC
  Filled 2021-05-13: qty 10

## 2021-05-13 MED ORDER — OXYTOCIN-SODIUM CHLORIDE 30-0.9 UT/500ML-% IV SOLN
INTRAVENOUS | Status: AC
  Administered 2021-05-13: 2 m[IU]/min via INTRAVENOUS
  Filled 2021-05-13: qty 1000

## 2021-05-13 MED ORDER — FENTANYL CITRATE (PF) 100 MCG/2ML IJ SOLN: 50.0000 ug | INTRAMUSCULAR | Status: DC | PRN

## 2021-05-13 MED ORDER — SOD CITRATE-CITRIC ACID 500-334 MG/5ML PO SOLN: 30.0000 mL | ORAL | Status: DC | PRN

## 2021-05-13 MED ORDER — PRENATAL MULTIVITAMIN CH
1.0000 | ORAL_TABLET | Freq: Every day | ORAL | Status: DC
  Filled 2021-05-13: qty 1

## 2021-05-13 MED ORDER — OXYTOCIN BOLUS FROM INFUSION
333.0000 mL | Freq: Once | INTRAVENOUS | Status: AC
  Administered 2021-05-13: 333 mL via INTRAVENOUS

## 2021-05-13 MED ORDER — ONDANSETRON HCL 4 MG/2ML IJ SOLN: 4.0000 mg | Freq: Four times a day (QID) | INTRAMUSCULAR | Status: DC | PRN

## 2021-05-13 MED ORDER — ACETAMINOPHEN 325 MG PO TABS
650.0000 mg | ORAL_TABLET | ORAL | Status: DC | PRN
  Administered 2021-05-13 – 2021-05-14 (×4): 650 mg via ORAL
  Filled 2021-05-13 (×4): qty 2

## 2021-05-13 MED ORDER — ONDANSETRON HCL 4 MG PO TABS
4.0000 mg | ORAL_TABLET | ORAL | Status: DC | PRN
Start: 2021-05-13 — End: 2021-05-15

## 2021-05-13 MED ORDER — SENNOSIDES-DOCUSATE SODIUM 8.6-50 MG PO TABS
2.0000 | ORAL_TABLET | Freq: Every day | ORAL | Status: DC
  Administered 2021-05-14: 2 via ORAL
  Filled 2021-05-13: qty 2

## 2021-05-13 MED ORDER — DIPHENHYDRAMINE HCL 25 MG PO CAPS: 25.0000 mg | ORAL_CAPSULE | Freq: Four times a day (QID) | ORAL | Status: DC | PRN

## 2021-05-13 MED ORDER — OXYCODONE HCL 5 MG PO TABS: 10.0000 mg | ORAL_TABLET | ORAL | Status: DC | PRN

## 2021-05-13 MED ORDER — TERBUTALINE SULFATE 1 MG/ML IJ SOLN: 0.2500 mg | Freq: Once | INTRAMUSCULAR | Status: DC | PRN

## 2021-05-13 MED ORDER — LIDOCAINE HCL (PF) 1 % IJ SOLN
30.0000 mL | INTRAMUSCULAR | Status: AC | PRN
  Administered 2021-05-13: 30 mL via SUBCUTANEOUS

## 2021-05-13 MED ORDER — WITCH HAZEL-GLYCERIN EX PADS
1.0000 "application " | MEDICATED_PAD | CUTANEOUS | Status: DC | PRN
  Administered 2021-05-13: 1 via TOPICAL
  Filled 2021-05-13 (×2): qty 100

## 2021-05-13 MED ORDER — OXYCODONE HCL 5 MG PO TABS
5.0000 mg | ORAL_TABLET | ORAL | Status: DC | PRN
  Administered 2021-05-13 – 2021-05-14 (×5): 5 mg via ORAL
  Filled 2021-05-13 (×5): qty 1

## 2021-05-13 MED ORDER — ACETAMINOPHEN 325 MG PO TABS: 650.0000 mg | ORAL_TABLET | ORAL | Status: DC | PRN

## 2021-05-13 MED ORDER — BENZOCAINE-MENTHOL 20-0.5 % EX AERO
1.0000 "application " | INHALATION_SPRAY | CUTANEOUS | Status: DC | PRN
  Administered 2021-05-13 – 2021-05-14 (×2): 1 via TOPICAL
  Filled 2021-05-13 (×3): qty 56

## 2021-05-13 MED ORDER — DIBUCAINE (PERIANAL) 1 % EX OINT
1.0000 "application " | TOPICAL_OINTMENT | CUTANEOUS | Status: DC | PRN
  Administered 2021-05-14: 1 via RECTAL
  Filled 2021-05-13: qty 28

## 2021-05-13 MED ORDER — COCONUT OIL OIL: 1.0000 "application " | TOPICAL_OIL | Status: DC | PRN

## 2021-05-13 MED ORDER — OXYTOCIN-SODIUM CHLORIDE 30-0.9 UT/500ML-% IV SOLN: 2.5000 [IU]/h | INTRAVENOUS | Status: DC

## 2021-05-13 MED ORDER — LIDOCAINE HCL (PF) 1 % IJ SOLN
INTRAMUSCULAR | Status: AC
  Filled 2021-05-13: qty 30

## 2021-05-13 MED ORDER — ONDANSETRON HCL 4 MG/2ML IJ SOLN: 4.0000 mg | INTRAMUSCULAR | Status: DC | PRN

## 2021-05-13 NOTE — H&P (Signed)
OB History & Physical   History of Present Illness:  Chief Complaint: leaking fluid, 3 gushes around 1pm  HPI:  [name] [name] is a 25 y.o. G2P1 female at [redacted]w[redacted]d dated by LMP 08/08/20 and c/w [place] at [redacted]w[redacted]d.  She presents to L&D for rupture of membranes.   Active FM, denies onset of ctx, SROM @ 1300, clear fluid. Denies bloody show.     Pregnancy Issues: 1. Varicella Non-Immune 2. Pt father with CF, pt negative screening.  3. Hx VAVD for exhaustion with partial 3rd deg lac.    Maternal Medical History:   Past Medical History:  Diagnosis Date  . Elevated LFTs   . Family history of adverse reaction to anesthesia    BROTHER-BECAME VERY HYPER AFTER SURGERY  . Gastritis 2019  . Headache    MIGRAINES    Past Surgical History:  Procedure Laterality Date  . CHOLECYSTECTOMY N/A 01/05/2019   Procedure: LAPAROSCOPIC CHOLECYSTECTOMY;  Surgeon: Henrene Dodge, MD;  Location: [facility] ORS;  Service: General;  Laterality: N/A;  . UPPER GI ENDOSCOPY  08/12/2018   Dr Mechele Collin    Allergies  Allergen Reactions  . Sulfa Antibiotics Other (See Comments)    Childhood allergy, do not know    Prior to Admission medications   Medication Sig Start Date End Date Taking? Authorizing Provider  acetaminophen (TYLENOL) 500 MG tablet Take 1,000 mg by mouth daily as needed for moderate pain or headache.    [provider]  Ascorbic Acid (VITAMIN C PO) Take 1 capsule by mouth daily.    [provider]  ibuprofen (ADVIL,MOTRIN) 600 MG tablet Take 1 tablet (600 mg total) by mouth every 8 (eight) hours as needed for mild pain or moderate pain. 01/05/19   Henrene Dodge, MD  omeprazole (PRILOSEC) 40 MG capsule Take 40 mg by mouth 2 (two) times daily.  09/22/18   [provider]  oxyCODONE (OXY IR/ROXICODONE) 5 MG immediate release tablet Take 1 tablet (5 mg total) by mouth every 4 (four) hours as needed for severe pain. Patient not taking: Reported on 01/21/2019 01/05/19   Henrene Dodge, MD   sucralfate (CARAFATE) 1 g tablet Take 1 g by mouth 3 (three) times daily before meals.  08/12/18   [provider]     Prenatal care site: [facility]   Social History: She  reports that she has never smoked. She has never used smokeless tobacco. She reports that she does not drink alcohol and does not use drugs.  Family History: family history includes Asthma in her father and sister; Birth defects in her maternal grandfather; COPD in her mother; Cervical cancer in her mother; Diabetes in her father; Diabetic kidney disease in her father.   Review of Systems: A full review of systems was performed and negative except as noted in the HPI.     Physical Exam:  Vital Signs: BP 120/71 (BP Location: Left Arm)   Pulse 91   Temp 98.2 F (36.8 C) (Oral)   Resp 18   LMP 08/08/2020  General: no acute distress.  HEENT: normocephalic, atraumatic Heart: regular rate & rhythm.  No murmurs/rubs/gallops Lungs: clear to auscultation bilaterally, normal respiratory effort Abdomen: soft, gravid, non-tender;  EFW:  Pelvic:   External: Normal external female genitalia  Cervix: 4/80/-1, soft, posterior,  Pooling clear fluid leaking noted on SSE.    Extremities: non-tender, symmetric, no edema bilaterally.  DTRs: 2+  Neurologic: Alert & oriented x 3.    No results found for  this or any previous visit (from the past 24 hour(s)).  Pertinent Results:  Prenatal Labs: Blood type/Rh O Pos  Antibody screen neg  Rubella Immune  Varicella  NON Immune  RPR NR  HBsAg Neg  HIV NR  GC neg  Chlamydia neg  Genetic screening Negative materniT21, female  1 hour GTT  94  3 hour GTT   GBS  Neg   Anatomy US: normal anatomy, fundal posterior placenta.   FHT: 130bpm, mod variability, + accels, no decels TOCO: irritability   Cephalic by leopolds/SVE  No results found.  Assessment:  [name] [name] is a 25 y.o. G2P1 female at [redacted]w[redacted]d with SROM.   Plan:  1. Admit to Labor & Delivery;  consents reviewed and obtained - notified Dr Feliberto Gottron of admission - COVID neg- done on 5/27   2. Fetal Well being  - Fetal Tracing: Cat I - Group B Streptococcus ppx indicated: neg - Presentation: cephalic confirmed by exam/Leopolds   3. Routine OB: - Prenatal labs reviewed, as above - Rh O Pos - CBC, T&S, RPR on admit - Clear fluids, IVF  4. Monitoring of Labor -  Contractions: external toco in place -  Pelvis proven to 6#11 -  Plan for augmentation with pitocin.  -  Plan for continuous fetal monitoring  -  Maternal pain control as desired - Anticipate vaginal delivery  5. Post Partum Planning: - Infant feeding: breast and formula - Contraception: NFP, partner considering vasectomy  - tdap 03/06/21 - flu declined  Randa Ngo, CNM 05/13/21 1:20 PM

## 2021-05-13 NOTE — Discharge Summary (Signed)
Obstetrical Discharge Summary  Patient Name: [name] [name] DOB: March 13, 1996 MRN: [id number]  Date of Admission: 05/13/2021 Date of Delivery: 05/13/21 Delivered by: Heloise Ochoa CNM Date of Discharge: 05/14/2021  Primary OB: [facility]  ACZ:YSAYTKZ'S last menstrual period was 08/08/2020. EDC Estimated Date of Delivery: 05/15/21 Gestational Age at Delivery: [redacted]w[redacted]d   Antepartum complications: 1. Varicella Non-Immune 2. Pt father with CF, pt negative screening.  3. Hx VAVD for exhaustion with partial 3rd deg lac.  Admitting Diagnosis:  Secondary Diagnosis: Patient Active Problem List   Diagnosis Date Noted  . Labor and delivery, indication for care 05/13/2021  . Pregnancy 04/20/2021  . Calculus of gallbladder without cholecystitis without obstruction 10/07/2018  . Decreased fetal movement 07/30/2017  . Premature rupture of membranes 07/30/2017  . Influenza A 01/09/2017    Augmentation: AROM and Pitocin Complications: None Intrapartum complications/course: see delivery note Date of Delivery: 05/13/21 at 2102 Delivered by: Heloise Ochoa CNM Delivery Type: spontaneous vaginal delivery Anesthesia: local Placenta: spontaneous Laceration: 2nd deg perineal Episiotomy: none Newborn Data: Live born female  Birth Weight:  7#13 APGAR: 8, 9  Newborn Delivery   Birth date/time: 05/13/2021 21:02:00 Delivery type: Vaginal, Spontaneous      Postpartum Procedures: none  Edinburgh:  Edinburgh Postnatal Depression Scale Screening Tool 05/14/2021 08/02/2017  I have been able to laugh and see the funny side of things. 0 0  I have looked forward with enjoyment to things. 1 0  I have blamed myself unnecessarily when things went wrong. 1 1  I have been anxious or worried for no good reason. 1 1  I have felt scared or panicky for no good reason. 0 0  Things have been getting on top of me. 1 1  I have been so unhappy that I have had difficulty sleeping. 0 1  I have felt sad or  miserable. 1 0  I have been so unhappy that I have been crying. 1 1  The thought of harming myself has occurred to me. 0 0  Edinburgh Postnatal Depression Scale Total 6 5      Post partum course:  Patient had an uncomplicated postpartum course.  By time of discharge on PPD#1, her pain was controlled on oral pain medications; she had appropriate lochia and was ambulating, voiding without difficulty and tolerating regular diet.  She was deemed stable for discharge to home.      Discharge Physical Exam:  BP (!) 99/51 (BP Location: Right Arm)   Pulse 73   Temp 98.4 F (36.9 C) (Oral)   Resp 18   Ht 5\' 2"  (1.575 m)   Wt 89.4 kg   LMP 08/08/2020   SpO2 98%   Breastfeeding Unknown   BMI 36.03 kg/m   General: NAD CV: RRR Pulm: CTABL, nl effort ABD: s/nd/nt, fundus firm and below the umbilicus Lochia: moderate Perineum: well approximated/intact DVT Evaluation: LE non-ttp, no evidence of DVT on exam.  Hemoglobin  Date Value Ref Range Status  05/14/2021 11.3 (L) 12.0 - 15.0 g/dL Final   HCT  Date Value Ref Range Status  05/14/2021 34.7 (L) 36.0 - 46.0 % Final     Disposition: stable, discharge to home. Baby Feeding: breastmilk and formula Baby Disposition: home with mom  Rh Immune globulin given: n/a Rubella vaccine given: immune Varicella vaccine given: NON-immune, offered prior to DC Tdap vaccine given in AP or PP setting: 03/06/21 Flu vaccine given in AP or PP setting: declined  Contraception: planning spouse vasectomy  Prenatal Labs:  Blood type/Rh O Pos  Antibody screen neg  Rubella Immune  Varicella  NON Immune  RPR NR  HBsAg Neg  HIV NR  GC neg  Chlamydia neg  Genetic screening Negative materniT21, female  1 hour GTT  94  3 hour GTT   GBS  Neg      Plan:  Yanelle L Job was discharged to home in good condition. Follow-up appointment with delivering provider in 6 weeks.  Discharge Medications: Allergies as of 05/14/2021      Reactions   Sulfa  Antibiotics Other (See Comments)   Childhood allergy, do not know      Medication List    STOP taking these medications   omeprazole 40 MG capsule Commonly known as: PRILOSEC   oxyCODONE 5 MG immediate release tablet Commonly known as: Oxy IR/ROXICODONE   sucralfate 1 g tablet Commonly known as: CARAFATE     TAKE these medications   acetaminophen 325 MG tablet Commonly known as: Tylenol Take 2 tablets (650 mg total) by mouth every 4 (four) hours as needed (for pain scale < 4). What changed:   medication strength  how much to take  when to take this  reasons to take this   benzocaine-Menthol 20-0.5 % Aero Commonly known as: DERMOPLAST Apply 1 application topically as needed for irritation (perineal discomfort).   coconut oil Oil Apply 1 application topically as needed.   dibucaine 1 % Oint Commonly known as: NUPERCAINAL Place 1 application rectally as needed for hemorrhoids.   ibuprofen 600 MG tablet Commonly known as: ADVIL Take 1 tablet (600 mg total) by mouth every 8 (eight) hours as needed for mild pain or moderate pain.   multivitamin-prenatal 27-0.8 MG Tabs tablet Take 1 tablet by mouth daily at 12 noon.   senna-docusate 8.6-50 MG tablet Commonly known as: Senokot-S Take 2 tablets by mouth daily.   simethicone 80 MG chewable tablet Commonly known as: MYLICON Chew 1 tablet (80 mg total) by mouth as needed for flatulence.   VITAMIN C PO Take 1 capsule by mouth daily.   witch hazel-glycerin pad Commonly known as: TUCKS Apply 1 application topically as needed for hemorrhoids.        Follow-up Information    Katharina Jehle, Prudencio Pair, CNM. Schedule an appointment as soon as possible for a visit in 6 week(s).   Specialty: Obstetrics and Gynecology Why: postpartum Contact information: [address] [place] [address] [phone]               Signed:  Randa Ngo, CNM 05/14/2021  10:19 PM

## 2021-05-14 LAB — CBC
HCT: 34.7 % — ABNORMAL LOW (ref 36.0–46.0)
Hemoglobin: 11.3 g/dL — ABNORMAL LOW (ref 12.0–15.0)
MCH: 28.8 pg (ref 26.0–34.0)
MCHC: 32.6 g/dL (ref 30.0–36.0)
MCV: 88.3 fL (ref 80.0–100.0)
Platelets: 251 10*3/uL (ref 150–400)
RBC: 3.93 MIL/uL (ref 3.87–5.11)
RDW: 14 % (ref 11.5–15.5)
WBC: 14.3 10*3/uL — ABNORMAL HIGH (ref 4.0–10.5)
nRBC: 0 % (ref 0.0–0.2)

## 2021-05-14 LAB — RPR: RPR Ser Ql: NONREACTIVE

## 2021-05-14 MED ORDER — ACETAMINOPHEN 325 MG PO TABS: 650.0000 mg | ORAL_TABLET | ORAL | Status: AC | PRN

## 2021-05-14 MED ORDER — WITCH HAZEL-GLYCERIN EX PADS: 1.0000 "application " | MEDICATED_PAD | CUTANEOUS | 0 refills | Status: AC | PRN

## 2021-05-14 MED ORDER — SENNOSIDES-DOCUSATE SODIUM 8.6-50 MG PO TABS: 2.0000 | ORAL_TABLET | Freq: Every day | ORAL | 0 refills | Status: AC

## 2021-05-14 MED ORDER — VARICELLA VIRUS VACCINE LIVE 1350 PFU/0.5ML IJ SUSR
0.5000 mL | Freq: Once | INTRAMUSCULAR | Status: DC
  Filled 2021-05-14: qty 0.5

## 2021-05-14 MED ORDER — BENZOCAINE-MENTHOL 20-0.5 % EX AERO: 1.0000 "application " | INHALATION_SPRAY | CUTANEOUS | Status: AC | PRN

## 2021-05-14 MED ORDER — COCONUT OIL OIL: 1.0000 "application " | TOPICAL_OIL | 0 refills | Status: AC | PRN

## 2021-05-14 MED ORDER — DIBUCAINE (PERIANAL) 1 % EX OINT: 1.0000 "application " | TOPICAL_OINTMENT | CUTANEOUS | 0 refills | Status: AC | PRN

## 2021-05-14 MED ORDER — SIMETHICONE 80 MG PO CHEW: 80.0000 mg | CHEWABLE_TABLET | ORAL | 0 refills | Status: AC | PRN

## 2021-05-14 NOTE — Progress Notes (Signed)
Order received to Discharge Patient to home.

## 2021-05-14 NOTE — Discharge Instructions (Signed)
Rest when Baby rests. Don't pick up anything heavier then your Baby. Notify your Provider if you have increased vaginal bleeding, (saturating a pad an hour), or if you pass a clot larger then a small plum. Notify your provider if you have a temperature or if you ar feeling depressed and can no longer preform your usually daily activities and/or the care of yourself and Baby. Notify Provider of any concerns.

## 2021-05-14 NOTE — Progress Notes (Signed)
Transferred to Room 350 Post Partum. Alert and Oriented with pleasant affect. Color good, skin w&d. Assessment and VS WNL. Oriented to room, Safety and Security and Fall prevention. Pt. V/o. Education initiated.

## 2021-05-14 NOTE — Progress Notes (Signed)
Post Partum Day 1 Subjective: Doing well, no complaints.  Tolerating regular diet, pain with PO meds, voiding and ambulating without difficulty.  No CP SOB Fever,Chills, N/V or leg pain; denies nipple or breast pain no HA change of vision, RUQ/epigastric pain  Objective: BP (!) 95/51 (BP Location: Left Arm) Comment: nurse Paige notified  Pulse 62   Temp 98.2 F (36.8 C) (Oral)   Resp 18   Ht 5\' 2"  (1.575 m)   Wt 89.4 kg   LMP 08/08/2020   SpO2 98%   Breastfeeding Unknown   BMI 36.03 kg/m    Physical Exam:  General: NAD Breasts: soft/nontender CV: RRR Pulm: nl effort, CTABL Abdomen: soft, NT, BS x 4 Perineum: minimal edema, laceration repair well approximated Lochia: small Uterine Fundus: fundus firm and 2 fb below umbilicus DVT Evaluation: no cords, ttp LEs   Recent Labs    05/13/21 1346 05/14/21 0403  HGB 11.6* 11.3*  HCT 34.8* 34.7*  WBC 8.5 14.3*  PLT 252 251    Assessment/Plan: 25 y.o. G2P1001 postpartum day # 1  - Continue routine PP care - Lactation consult prn - Discussed contraceptive options including implant, IUDs hormonal and non-hormonal, injection, pills/ring/patch, condoms, and NFP. Plans spouse vasectomy - Acute blood loss anemia - hemodynamically stable and asymptomatic; start po ferrous sulfate BID with stool softeners  - Immunization status: Needs varicella prior to DC    Disposition: Does desire Dc home today- pending peds exam for baby at 24hrs of life.      22, CNM 05/14/2021  8:58 AM

## 2021-05-14 NOTE — Lactation Note (Signed)
This note was copied from a baby's chart. Lactation Consultation Note  Patient Name: [name] [name] CZYSA'Y Date: 05/14/2021 Reason for consult: Initial assessment;Mother's request;Term;Other (Comment) (Mom requesting help to set up her personal pump) Age:26 hours  Maternal Data Has patient been taught Hand Expression?: Yes Does the patient have breastfeeding experience prior to this delivery?: Yes How long did the patient breastfeed?: Mom reports for a little while with first  Feeding Mother's Current Feeding Choice: Breast Milk  Observed mom breast feeding [name].  She latched well with strong, rhythmic sucking and occasional swallows.  She prefers the left breast better probably b/c right nipple is larger.  Once she get good deep latch she sucked for 20 minutes.  Mom wanted to know how to set up her personal single avent pump.  After mom pumped for a few minutes, her nipple was slightly sore and red.  Mom reports wanting to make sure she emptied her breasts because with her last baby she had so much milk that she ended up with mastitis and thrush and had to go back to work at 8 weeks so just gave up.  She only pumped for a few minutes.  She expressed 1 ml which was pulled up in 1 ml TB syringe.  We did not give it at present b/c Adley was sound asleep. Discouraged pumping until mature milk is in and breast feeding is well established.  Mom agreed.  Mom reports also having a Medela pump at home that she used with her last.  Hand out given on what to expect with breast feeding the first 4 days of life and reviewed newborn stomach size, adequate intake and out put, normal course of lactation, feeding cues, supply and demand and routine newborn feeding patterns.  Lactation Resource sheet given and reviewed support groups, informative web sites and contact numbers. Lactation name and number written on white board and encouraged to call with any questions, concerns or assistance. LATCH  Score Latch: Repeated attempts needed to sustain latch, nipple held in mouth throughout feeding, stimulation needed to elicit sucking reflex.  Audible Swallowing: A few with stimulation  Type of Nipple: Everted at rest and after stimulation (Large nipples, especially on right)  Comfort (Breast/Nipple): Filling, red/small blisters or bruises, mild/mod discomfort  Hold (Positioning): No assistance needed to correctly position infant at breast.  LATCH Score: 7   Lactation Tools Discussed/Used Tools: Pump Breast pump type: Other (comment) (Mom's own personal Hoy Finlay single pump) Pump Education: Setup, frequency, and cleaning;Milk Storage;Other (comment) Reason for Pumping: Mom was worried about getting mastitis again like she did with first Pumping frequency: Discouraged pumping & encouraged to just breast feed until milk in and established Pumped volume: 1 mL  Interventions Interventions: Breast feeding basics reviewed;Breast massage;Hand express;Reverse pressure;Breast compression;Support pillows;Position options;Education  Discharge Discharge Education: Engorgement and breast care;Warning signs for feeding baby;Other (comment) Pump: Personal (Has single [employer] pump) WIC Program: Yes  Consult Status Consult Status: Follow-up Date: 05/14/21 Follow-up type: Call as needed    Louis Meckel 05/14/2021, 2:37 PM

## 2021-05-14 NOTE — Plan of Care (Signed)
Post Partum 

## 2021-05-14 NOTE — Progress Notes (Signed)
Discharged to Home with Husband. V/O of all D/c Instructions and F/U appointments via [employer].

## 2021-05-15 ENCOUNTER — Inpatient Hospital Stay: Admit: 2021-05-15 | Payer: Self-pay
